# Patient Record
Sex: Female | Born: 1965 | Race: White | Hispanic: No | Smoking: Former smoker
Health system: Southern US, Community
[De-identification: ages and names within clinical notes are randomized; demographics above are authoritative.]

## PROBLEM LIST (undated history)

## (undated) DIAGNOSIS — F419 Anxiety disorder, unspecified: Secondary | ICD-10-CM

## (undated) DIAGNOSIS — L509 Urticaria, unspecified: Secondary | ICD-10-CM

## (undated) DIAGNOSIS — F32A Depression, unspecified: Secondary | ICD-10-CM

## (undated) DIAGNOSIS — K589 Irritable bowel syndrome without diarrhea: Secondary | ICD-10-CM

## (undated) DIAGNOSIS — L309 Dermatitis, unspecified: Secondary | ICD-10-CM

## (undated) DIAGNOSIS — F329 Major depressive disorder, single episode, unspecified: Secondary | ICD-10-CM

## (undated) DIAGNOSIS — G4700 Insomnia, unspecified: Secondary | ICD-10-CM

## (undated) HISTORY — DX: Major depressive disorder, single episode, unspecified: F32.9

## (undated) HISTORY — DX: Anxiety disorder, unspecified: F41.9

## (undated) HISTORY — DX: Depression, unspecified: F32.A

## (undated) HISTORY — PX: VAGINAL HYSTERECTOMY: SUR661

## (undated) HISTORY — DX: Urticaria, unspecified: L50.9

## (undated) HISTORY — PX: CHOLECYSTECTOMY: SHX55

## (undated) HISTORY — PX: TYMPANOSTOMY TUBE PLACEMENT: SHX32

## (undated) HISTORY — PX: OTHER SURGICAL HISTORY: SHX169

## (undated) HISTORY — PX: TONSILLECTOMY: SUR1361

## (undated) HISTORY — DX: Irritable bowel syndrome, unspecified: K58.9

## (undated) HISTORY — DX: Dermatitis, unspecified: L30.9

## (undated) HISTORY — DX: Insomnia, unspecified: G47.00

---

## 1998-02-01 ENCOUNTER — Other Ambulatory Visit: Admission: RE | Admit: 1998-02-01 | Discharge: 1998-02-01 | Payer: Self-pay | Admitting: Gastroenterology

## 1999-01-18 ENCOUNTER — Other Ambulatory Visit: Admission: RE | Admit: 1999-01-18 | Discharge: 1999-01-18 | Payer: Self-pay | Admitting: Obstetrics and Gynecology

## 1999-04-27 ENCOUNTER — Ambulatory Visit: Admission: RE | Admit: 1999-04-27 | Discharge: 1999-04-27 | Payer: Self-pay | Admitting: Obstetrics and Gynecology

## 1999-07-13 ENCOUNTER — Ambulatory Visit (HOSPITAL_COMMUNITY): Admission: RE | Admit: 1999-07-13 | Discharge: 1999-07-13 | Payer: Self-pay | Admitting: Internal Medicine

## 1999-07-13 ENCOUNTER — Encounter: Payer: Self-pay | Admitting: Internal Medicine

## 1999-11-14 ENCOUNTER — Ambulatory Visit (HOSPITAL_COMMUNITY): Admission: RE | Admit: 1999-11-14 | Discharge: 1999-11-14 | Payer: Self-pay | Admitting: Obstetrics and Gynecology

## 2000-03-14 ENCOUNTER — Other Ambulatory Visit: Admission: RE | Admit: 2000-03-14 | Discharge: 2000-03-14 | Payer: Self-pay | Admitting: Obstetrics and Gynecology

## 2000-08-18 ENCOUNTER — Encounter (INDEPENDENT_AMBULATORY_CARE_PROVIDER_SITE_OTHER): Payer: Self-pay | Admitting: Specialist

## 2000-08-18 ENCOUNTER — Ambulatory Visit (HOSPITAL_BASED_OUTPATIENT_CLINIC_OR_DEPARTMENT_OTHER): Admission: RE | Admit: 2000-08-18 | Discharge: 2000-08-18 | Payer: Self-pay | Admitting: *Deleted

## 2000-12-01 ENCOUNTER — Encounter (INDEPENDENT_AMBULATORY_CARE_PROVIDER_SITE_OTHER): Payer: Self-pay | Admitting: Specialist

## 2000-12-01 ENCOUNTER — Other Ambulatory Visit: Admission: RE | Admit: 2000-12-01 | Discharge: 2000-12-01 | Payer: Self-pay | Admitting: *Deleted

## 2001-07-28 ENCOUNTER — Other Ambulatory Visit: Admission: RE | Admit: 2001-07-28 | Discharge: 2001-07-28 | Payer: Self-pay | Admitting: *Deleted

## 2001-08-25 ENCOUNTER — Encounter (INDEPENDENT_AMBULATORY_CARE_PROVIDER_SITE_OTHER): Payer: Self-pay

## 2001-08-25 ENCOUNTER — Encounter: Payer: Self-pay | Admitting: *Deleted

## 2001-08-25 ENCOUNTER — Observation Stay (HOSPITAL_COMMUNITY): Admission: RE | Admit: 2001-08-25 | Discharge: 2001-08-26 | Payer: Self-pay | Admitting: *Deleted

## 2001-12-29 ENCOUNTER — Encounter: Admission: RE | Admit: 2001-12-29 | Discharge: 2001-12-29 | Payer: Self-pay | Admitting: Gastroenterology

## 2001-12-29 ENCOUNTER — Encounter: Payer: Self-pay | Admitting: Gastroenterology

## 2002-11-30 ENCOUNTER — Other Ambulatory Visit: Admission: RE | Admit: 2002-11-30 | Discharge: 2002-11-30 | Payer: Self-pay | Admitting: *Deleted

## 2003-12-22 ENCOUNTER — Other Ambulatory Visit: Admission: RE | Admit: 2003-12-22 | Discharge: 2003-12-22 | Payer: Self-pay | Admitting: *Deleted

## 2004-09-13 DIAGNOSIS — K5090 Crohn's disease, unspecified, without complications: Secondary | ICD-10-CM | POA: Insufficient documentation

## 2004-09-13 DIAGNOSIS — D126 Benign neoplasm of colon, unspecified: Secondary | ICD-10-CM | POA: Insufficient documentation

## 2004-12-17 ENCOUNTER — Other Ambulatory Visit: Admission: RE | Admit: 2004-12-17 | Discharge: 2004-12-17 | Payer: Self-pay | Admitting: *Deleted

## 2006-01-15 ENCOUNTER — Ambulatory Visit: Payer: Self-pay | Admitting: Cardiovascular Disease

## 2006-01-20 ENCOUNTER — Other Ambulatory Visit: Admission: RE | Admit: 2006-01-20 | Discharge: 2006-01-20 | Payer: Self-pay | Admitting: *Deleted

## 2007-01-29 ENCOUNTER — Other Ambulatory Visit: Admission: RE | Admit: 2007-01-29 | Discharge: 2007-01-29 | Payer: Self-pay | Admitting: *Deleted

## 2007-06-29 ENCOUNTER — Ambulatory Visit: Payer: Self-pay | Admitting: Cardiology

## 2007-06-29 LAB — CONVERTED CEMR LAB
Free T4: 0.8 ng/dL
T3, Free: 2.9 pg/mL
TSH: 0.6 u[IU]/mL

## 2007-08-03 ENCOUNTER — Ambulatory Visit: Payer: Self-pay | Admitting: Gastroenterology

## 2007-08-05 ENCOUNTER — Encounter: Payer: Self-pay | Admitting: Internal Medicine

## 2007-08-05 ENCOUNTER — Ambulatory Visit: Payer: Self-pay | Admitting: Internal Medicine

## 2007-08-13 ENCOUNTER — Ambulatory Visit: Payer: Self-pay | Admitting: Gastroenterology

## 2007-08-21 ENCOUNTER — Ambulatory Visit: Payer: Self-pay | Admitting: Gastroenterology

## 2007-08-27 ENCOUNTER — Ambulatory Visit: Payer: Self-pay | Admitting: Gastroenterology

## 2007-09-04 ENCOUNTER — Ambulatory Visit: Payer: Self-pay | Admitting: Gastroenterology

## 2007-09-15 ENCOUNTER — Ambulatory Visit: Payer: Self-pay | Admitting: Gastroenterology

## 2007-11-13 ENCOUNTER — Ambulatory Visit: Payer: Self-pay | Admitting: Gastroenterology

## 2008-03-28 ENCOUNTER — Other Ambulatory Visit: Admission: RE | Admit: 2008-03-28 | Discharge: 2008-03-28 | Payer: Self-pay | Admitting: Gynecology

## 2008-04-04 ENCOUNTER — Encounter: Admission: RE | Admit: 2008-04-04 | Discharge: 2008-04-04 | Payer: Self-pay | Admitting: Gynecology

## 2008-04-12 ENCOUNTER — Encounter: Admission: RE | Admit: 2008-04-12 | Discharge: 2008-04-12 | Payer: Self-pay | Admitting: Gynecology

## 2009-01-06 ENCOUNTER — Encounter: Payer: Self-pay | Admitting: Internal Medicine

## 2009-01-06 DIAGNOSIS — F329 Major depressive disorder, single episode, unspecified: Secondary | ICD-10-CM | POA: Insufficient documentation

## 2009-01-06 DIAGNOSIS — G44209 Tension-type headache, unspecified, not intractable: Secondary | ICD-10-CM | POA: Insufficient documentation

## 2009-01-06 DIAGNOSIS — Z9189 Other specified personal risk factors, not elsewhere classified: Secondary | ICD-10-CM | POA: Insufficient documentation

## 2009-03-29 ENCOUNTER — Encounter: Admission: RE | Admit: 2009-03-29 | Discharge: 2009-03-29 | Payer: Self-pay | Admitting: Gynecology

## 2009-07-31 ENCOUNTER — Ambulatory Visit: Payer: Self-pay | Admitting: Cardiovascular Disease

## 2009-07-31 ENCOUNTER — Encounter (INDEPENDENT_AMBULATORY_CARE_PROVIDER_SITE_OTHER): Payer: Self-pay | Admitting: *Deleted

## 2009-07-31 LAB — CONVERTED CEMR LAB
Cholesterol: 199 mg/dL (ref 0–200)
LDL Cholesterol: 121 mg/dL — ABNORMAL HIGH (ref 0–99)
Triglycerides: 93 mg/dL (ref 0.0–149.0)
VLDL: 18.6 mg/dL (ref 0.0–40.0)

## 2010-07-08 DIAGNOSIS — M797 Fibromyalgia: Secondary | ICD-10-CM

## 2010-07-08 HISTORY — DX: Fibromyalgia: M79.7

## 2010-08-07 NOTE — Letter (Signed)
Summary: Custom - Lipid  [facility], Main Office  [address]   [place], [place] [address]   Phone: [phone]  Fax: [phone]     July 31, 2009 MRN: [id number]   [facility] [address] [place], [place]  [address]   Dear Janice Spencer,  We have reviewed your cholesterol results.  They are as follows:     Total Cholesterol:    199 (Desirable: less than 200)       HDL  Cholesterol:     59.20  (Desirable: greater than 40 for men and 50 for women)       LDL Cholesterol:       121  (Desirable: less than 100 for low risk and less than 70 for moderate to high risk)       Triglycerides:       93.0  (Desirable: less than 150)  Our recommendations include:These numbers look good. Continue on the same medicine. Liver function is normal. Take care, Dr. Leanora Cover   Call our office at the number listed above if you have any questions.  Lowering your LDL cholesterol is important, but it is only one of a large number of "risk factors" that may indicate that you are at risk for heart disease, stroke or other complications of hardening of the arteries.  Other risk factors include:   A.  Cigarette Smoking* B.  High Blood Pressure* C.  Obesity* D.   Low HDL Cholesterol (see yours above)* E.   Diabetes Mellitus (higher risk if your is uncontrolled) F.  Family history of premature heart disease G.  Previous history of stroke or cardiovascular disease    *These are risk factors YOU HAVE CONTROL OVER.  For more information, visit .  There is now evidence that lowering the TOTAL CHOLESTEROL AND LDL CHOLESTEROL can reduce the risk of heart disease.  The American Heart Association recommends the following guidelines for the treatment of elevated cholesterol:  1.  If there is now current heart disease and less than two risk factors, TOTAL CHOLESTEROL should be less than 200 and LDL CHOLESTEROL should be less than 100. 2.  If there is current heart disease or two or  more risk factors, TOTAL CHOLESTEROL should be less than 200 and LDL CHOLESTEROL should be less than 70.  A diet low in cholesterol, saturated fat, and calories is the cornerstone of treatment for elevated cholesterol.  Cessation of smoking and exercise are also important in the management of elevated cholesterol and preventing vascular disease.  Studies have shown that 30 to 60 minutes of physical activity most days can help lower blood pressure, lower cholesterol, and keep your weight at a healthy level.  Drug therapy is used when cholesterol levels do not respond to therapeutic lifestyle changes (smoking cessation, diet, and exercise) and remains unacceptably high.  If medication is started, it is important to have you levels checked periodically to evaluate the need for further treatment options.  Thank you,    [employer] Team

## 2010-10-03 ENCOUNTER — Other Ambulatory Visit: Payer: Self-pay | Admitting: *Deleted

## 2010-10-03 ENCOUNTER — Telehealth: Payer: Self-pay | Admitting: *Deleted

## 2010-10-03 DIAGNOSIS — J40 Bronchitis, not specified as acute or chronic: Secondary | ICD-10-CM

## 2010-10-03 MED ORDER — AZITHROMYCIN 250 MG PO TABS: 250.0000 mg | ORAL_TABLET | Freq: Every day | ORAL | Status: DC

## 2010-10-03 NOTE — Telephone Encounter (Signed)
ERROR

## 2010-10-22 LAB — HM MAMMOGRAPHY: HM Mammogram: NORMAL

## 2010-11-23 NOTE — Op Note (Signed)
[facility]  Patient:    Janice Spencer, Janice Spencer Visit Number: [id number] MRN: [id number]          Service Type: SUR Location: 4W 0455 01 Attending Physician:  Collene Schlichter Dictated by:   Almedia Balls Randell Patient, M.D. Proc. Date: 08/25/01 Admit Date:  08/25/2001   CC:         Harl Bowie, M.D.   Operative Report  PREOPERATIVE DIAGNOSES: 1. Abnormal uterine bleeding. 2. History of endometriosis. 3. Uterine enlargement. 4. Pelvic pain. 5. Pelvic adhesions.  POSTOPERATIVE DIAGNOSES: 1. Abnormal uterine bleeding. 2. History of endometriosis. 3. Uterine enlargement. 4. Pelvic pain. 5. Pelvic adhesions. 6. Pending pathology.  OPERATION: 1. Laparoscopy. 2. Vaginal hysterectomy. 3. Left salpingo-oophorectomy.  ANESTHESIA:  General orotracheal.  OPERATOR:  Almedia Balls. Randell Patient, M.D.  FIRST ASSISTANT:  Harl Bowie, M.D.  INDICATION FOR SURGERY:  The patient is a 45 year old with the above-noted problems, who was counseled as to the need for surgery to treat these problems.  She was fully counseled as to the nature of the procedure and the risks involved to include risks of anesthesia, injury to bowel, bladder, blood vessels, ureters, postoperative hemorrhage, infection, recuperation, use of hormone replacement should her ovaries be removed.  She fully understands all of these considerations and wishes to proceed on August 25, 2001.  OPERATIVE FINDINGS:  On laparoscopy, the lower liver edge, gallbladder, spleen, area of the appendix were normal to visualization.  In the pelvis, the uterus was enlarged to approximately [redacted] weeks gestational size.  The right tube and ovary appeared normal.  The left tube and ovary were involved with adhesions from the rectosigmoid overlying these structures.  DESCRIPTION OF PROCEDURE:  With the patient under general anesthesia, prepared and draped in the usual sterile fashion, a speculum was placed in the  vagina, and the cervix was grasped with a single-tooth tenaculum and an acorn cannula was placed as well.  The patient was then catheterized with free flow of clear urine.  She was then prepared and draped for a laparoscopy procedure.  An incision was made in the lower pole of the umbilicus with insertion of the Veress cannula and insufflation of 3 liters of carbon dioxide.  A disposable 10 mm probe was inserted through this incision with placement of the 10 mm operative scope as well.  A 5 mm disposable probe was placed through the abdomen just above the symphysis pubis for probe sites.  The above-noted findings were visualized.  The left tube and ovary were then grasped with atraumatic graspers and elevated away from the lateral pelvic sidewall. Bipolar  electrocoagulation was used very close to the uterus with the infundibulopelvic ligament to provide hemostasis of the vessels.  These structures were sequentially electrocoagulated and cut free so that the left tube and ovary could be removed with the specimen.  On the right, the uteroovarian anastomosis, tube, and round ligament were rendered hemostatic with bipolar electrocoagulation.  These areas were gradually transected so that the right tube and ovary was conserved.  A bladder flap was created on the anterior surface of the uterus by using bipolar electrocoagulation for hemostasis and then gradual transection for development of the bladder flap. At this point, with good hemostasis in the peritoneal area, the patient was positioned and draped for vaginal procedure.  A weighted speculum was placed in the posterior vagina, and the cervix was grasped with two Loman [place].  A solution of 1% lidocaine with 1:200,000 epinephrine was injected circumferentially on  the cervix.  A total of 10 mL was used.  An incision was made in the vaginal mucosa anterior to the cervix for development of the bladder flap anteriorly.  The bladder was  pushed off of the cervix and lower uterine segment without difficulty.  An attempt was made to invert the uterus which was unsuccessful.  Accordingly, an incision was made in the posterior cul-de-sac with entry into this area without difficulty. A longer weighted speculum was placed in the posterior cul-de-sac, and Heaney clamps were used to clamp in succession the vaginal cuff angles and uterosacral ligaments bilaterally, cardinal ligaments bilaterally, and uterine vessels bilaterally; these structures were successfully cut free and individually suture ligated with 1 chromic catgut.  It was then possible to invert the uterus, and Heaney clamps were placed across the remaining portions of the broad ligaments bilaterally which were remaining.  These structures were cut with removal of the uterus and left tube and ovary.  The broad ligament tags were tied off with interrupted free ties of 1 chromic catgut. The area was then lavaged with copious amounts of Lactate Ringers solution and, after noting that hemostasis was maintained and that sponge and instruments were correct, the peritoneum was closed with a pursestring suture of #1 chromic catgut.  A modified McCall suture had been placed in the posterior cul-de-sac prior to closure of the peritoneum as well.  Vaginal cuff was then reapproximated and rendered hemostatic with a continuous interlocking suture of 1 chromic catgut.  The uterosacral ligament sutures which had been held long were then tied together in the midline for further compression and hemostasis.  The McCall suture was then tied with good elevation of the vaginal cuff.  The area was then lavaged with copious amounts of Lactated Ringers solution and, after noting that hemostasis was maintained in this area, the patient was catheterized with free flow of clear urine.  She was then positioned and draped for a laparoscopic procedure.  The area was inspected closely using the  laparoscope and lavaged with copious amounts of  Lactated Ringers solution.  After noting that hemostasis was maintained following reduction of intra-abdominal pressure by allowing gas to escape and that sponge and instruments counts were correct, the instruments were removed from the peritoneal cavity.  Gas was allowed to fully escape, and the incisions were closed with fascial sutures of 0 Vicryl and subcuticular sutures of 3-0 plain catgut.  Estimated blood loss 100 mL.  The patient was taken to the recovery room in good condition.  She will be placed on 23-hour observation following surgery. Dictated by:   Almedia Balls Randell Patient, M.D. Attending Physician:  Collene Schlichter DD:  08/25/01 TD:  08/25/01 Job: [id number]

## 2010-11-23 NOTE — Discharge Summary (Signed)
[facility]  Patient:    Janice Spencer, Janice Spencer Visit Number: [id number] MRN: [id number]          Service Type: SUR Location: 4W 0455 01 Attending Physician:  Collene Schlichter Dictated by:   Almedia Balls Randell Patient, M.D. Admit Date:  08/25/2001 Disc. Date: 08/26/01                             Discharge Summary  HISTORY OF PRESENT ILLNESS:  The patient is a 45 year old with abnormal uterine bleeding, pelvic pain, uterine enlargement, history of endometriosis, pelvic adhesions for hysterectomy with possible bilateral salpingo-oophorectomy.  The remainder of her History and Physical are as previously dictated.  LABORATORY DATA AND X-RAY FINDINGS:  Preoperative hemoglobin of 15.1.  Chest x-ray showed no acute distress, but some increased markings.  HOSPITAL COURSE:  The patient was taken to the operating room on August 25, 2001, at which time laparoscopy, vaginal hysterectomy and left salpingo-oophorectomy were performed.  The patient did well postoperatively except that late in the evening on February 18, she was unable to void and a Foley catheter had to be placed.  The catheter was removed on the early morning of February 19, and the patient began voiding without difficulty.  On the morning of February 19, she was ambulatory, voiding without difficulty and in good condition.  It was felt that she could be discharged at this time.  DISCHARGE DIAGNOSES: 1. Abnormal uterine bleeding. 2. Pelvic pain. 3. History of endometriosis. 4. History of ovarian cyst. 5. History of pelvic adhesions.  PROCEDURE:  Laparoscopy with vaginal hysterectomy and left salpingo-oophorectomy.  Pathology report unavailable at the time of dictation.  DISPOSITION:  Discharged home to return to the office in two weeks for followup.  ACTIVITY:  She was instructed to gradually progress her activities over several weeks at home and limit driving for two weeks.  SPECIAL INSTRUCTIONS:   She will call for any problems.  DISCHARGE MEDICATIONS: 1. Darvocet-N 100 generic #30 to be taken 1/2 to 1 q.4h. p.r.n. pain. 2. Doxycycline 100 mg #12 to be taken one b.i.d.  DIET:  Regular.  CONDITION ON DISCHARGE:  Good. Dictated by:   Almedia Balls Randell Patient, M.D. Attending Physician:  Collene Schlichter DD:  08/26/01 TD:  08/26/01 Job: [id number]

## 2010-11-23 NOTE — Op Note (Signed)
[facility]  Patient:    Janice Spencer, Janice Spencer                    MRN: [id number] Proc. Date: 08/18/00 Attending:  Janet Berlin. Dan Humphreys, M.D.                           Operative Report  PREOPERATIVE DIAGNOSIS:  Pigmented skin lesion of right lower chin, 4 mm.  POSTOPERATIVE DIAGNOSIS:  Pigmented skin lesion of right lower chin, 4 mm.  PROCEDURE PERFORMED:  Excisional biopsy of 4 mm pigmented skin lesion of right lower chin.  SURGEON:  Janet Berlin. Dan Humphreys, M.D.  ASSISTANT:  None.  ANESTHESIA:  Local.  INDICATIONS:  Janice Spencer is a 45 year old young woman, who presents with a pigmented skin lesion in the right lower chin.  This has recently begun to intermittently bleed.  She is taken to the operating room at this time for excisional biopsy.  DESCRIPTION OF PROCEDURE:  The patient was brought back to the minor surgery operating room and placed on the table in a supine position.  The operative field was sterilely prepped and draped.  I used 1% Xylocaine containing epinephrine to achieve local anesthesia.  The lesion was then excised full-thickness and passed off the operative field for permanent sections. Resulting defect was linearly closed with three interrupted 6-0 monofilament nylon sutures.  A sterile dressing was placed.  The patient was given wound care instructions and will see me later this week for wound check, suture removal, and review of her final pathology report. DD:  08/18/00 TD:  08/18/00 Job: [id number]

## 2010-11-23 NOTE — Op Note (Signed)
[facility]  Patient:    Janice Spencer, Janice Spencer                     MRN: [id number] Proc. Date: 11/14/99 Adm. Date:  [id number] Attending:  Madelyn Flavors                           Operative Report  PREOPERATIVE DIAGNOSIS:       Menometrorrhagia, endometrial polyps.  POSTOPERATIVE DIAGNOSIS:      Menometrorrhagia, endometrial polyps.  OPERATION:                    Dilatation and curettage, hysteroscopy.  SURGEON:                      Beather Arbour. Thomasena Edis, M.D.  ASSISTANT:  ANESTHESIA:                   Monitored anesthesia care plus 10 cc of 1% lidocaine paracervical block.  ESTIMATED BLOOD LOSS:         Minimal.  DRAINS:                       None.  FLUIDS:                       Approximately 900 cc of Crystalloid.  COMPLICATIONS:                None.  DESCRIPTION OF PROCEDURE:     The patient was brought to the operating room and  identified on the operating table.  After the patient was adequately sedated with MAC analgesia, she was placed in the dorsal lithotomy position and prepped and draped in the usual sterile fashion.  The bladder was straight catheterized for  approximately 100 cc of clear yellow urine.  Bimanual examination was performed and the uterus was found to be in the anteverted position.  The anterior lip of the  cervix was infiltrated with 1 cc of 1% lidocaine and grasped with a single tooth tenaculum.  The remaining 9 cc were placed for a paracervical block.  The cervix was very gently dilated to a #25 Pratt dilator.  The hysteroscope was placed. Using Sorbitol as the distending medium, a very careful and thorough hysteroscopy was performed.  The polypoid tissue was again identified.  Both tubal ostia were identified.  The scope was removed and the uterus was systematically curetted in a systematic clockwise fashion with tissue obtained and sent to pathology for examination.  The Randall stone forceps were placed  and additional tissue was obtained.  The hysteroscope was again replaced and the area was noted to have been sampled in its entirety.  At that point the procedure was terminated.  The patient tolerated the procedure well without apparent complications and was transferred to the recovery room in stable condition after all sponge, needle, and instrument counts were correct.  The patient was urged to take Advil 400 to 600 mg every six hours as needed for pain, call with any problems and return in two to three weeks for a postoperative evaluation. DD:  11/14/99 TD:  11/15/99 Job: [id number]

## 2010-11-23 NOTE — H&P (Signed)
[facility]  Patient:    Janice Spencer, Janice Spencer Visit Number: [id number] MRN: [id number]          Service Type: Attending:  Almedia Balls. Randell Patient, M.D. Dictated by:   Almedia Balls Randell Patient, M.D. Adm. Date:  08/25/01                           History and Physical  CHIEF COMPLAINT:  Pain, abnormal bleeding.  HISTORY OF PRESENT ILLNESS:  The patient is a 45 year old gravida 2, para 2, whose last menstrual period was approximately six months ago.  She has been followed in our office since November 2001, with abnormal uterine bleeding and pain with use of oral contraceptives for menstrual control.  She began to break through on the oral contraceptives and have pain, particularly in the left lower quadrant, which was evaluated with ultrasound and laser laparoscopy, which showed bilateral ovarian cysts which were benign and extensive adhesions on the left adnexal areas as well as some endometriosis. The procedure was accomplished in May 2002.  Because of the endometriosis, it was suggested that she take continuous oral contraceptives for six months, which was attempted.  The patient continued to have breakthrough bleeding and severe pain, particularly on the left, with this regime and was tried on other medications including Lupron Depot without success.  She continued to bleed and have pain on the Lupron Depot and has required narcotics at times for the pain.  Examination in January 2003, revealed tenderness again on the left with enlargement of the uterus, thought to be adenomyosis.  Pap smear at that time was normal.  She is admitted at this time for laparoscopic-assisted vaginal hysterectomy, possible transabdominal hysterectomy, positive bilateral salpingo-oophorectomy.  She has been fully counseled as to the nature of the procedure and the risks involved to include risks of anesthesia, injury to bowel, bladder, blood vessels, ureters, postoperative hemorrhage,  infection, recuperation, and use of hormone replacement should her ovaries be removed. She fully understands all of these considerations and wishes to proceed on Aug 25, 2001.  PAST MEDICAL HISTORY: 1. Cholecystectomy in 1988. 2. D&C for abnormal bleeding in May 2001. 3. Bone spur from nose, nasal septum in 2000. 4. Laparoscopy in 2002.  ALLERGIES:  She is allergic to CODEINE, MORPHINE, and PENICILLIN.  MEDICATIONS:  She has taken the above-noted medications and has continued on Darvocet for pain.  TOBACCO/ALCOHOL:  She smokes approximately one pack of cigarettes per day.  FAMILY HISTORY:  Noncontributory.  REVIEW OF SYSTEMS:  HEENT:  Stress-related headaches, improved with simple medications.  CARDIORESPIRATORY:  Negative.  GASTROINTESTINAL:  Negative. GENITOURINARY:  As in present illness.  NEUROMUSCULAR:  Negative.  PHYSICAL EXAMINATION:  VITAL SIGNS:  Height 5 feet 4 inches, weight 133 pounds.  Blood pressure 120/64, pulse 72, respirations 18.  GENERAL:  Well-developed white female in no acute distress.  HEENT:  Within normal limits.  NECK:  Supple.  Without masses, adenopathy, or bruits.  HEART:  Regular rate and rhythm.  Without murmurs.  LUNGS:  Clear to P&A.  BREASTS:  Examined sitting and lying, without mass.  Axilla negative.  ABDOMEN:  Flat and soft.  Somewhat tender in the left lower quadrant, without palpable mass.  PELVIC:  External genitalia, Bartholin, urethral, and Skenes glands within normal limits.  Cervix slightly inflamed.  Uterus mid position, approximately 6-[redacted] weeks gestational size.  Somewhat soft and tender.  Adnexa tender bilaterally, left greater than right, with some fullness on the  left. Anterior and posterior cul-de-sac exam was confirmatory.  EXTREMITIES:  Within normal limits.  CNS:  Grossly intact.  SKIN:  Without suspicious lesions.  IMPRESSION:  Pelvic pain.  Abnormal uterine bleeding.  Uterine enlargement, rule out  adenomyosis, pelvic adhesions.  PLAN:  As noted above. Dictated by:   Almedia Balls Randell Patient, M.D. Attending:  Almedia Balls. Fore, M.D. DD:  08/20/01 TD:  08/20/01 Job: [id number]

## 2011-04-29 ENCOUNTER — Ambulatory Visit (INDEPENDENT_AMBULATORY_CARE_PROVIDER_SITE_OTHER): Payer: Self-pay

## 2011-04-29 DIAGNOSIS — R197 Diarrhea, unspecified: Secondary | ICD-10-CM

## 2011-05-03 ENCOUNTER — Telehealth: Payer: Self-pay | Admitting: Internal Medicine

## 2011-05-03 NOTE — Telephone Encounter (Signed)
Faxed patient's ROI to [phone] Attn: Bennie Dallas...05-03-11 djc

## 2011-05-13 ENCOUNTER — Encounter: Payer: Self-pay | Admitting: Internal Medicine

## 2011-05-13 ENCOUNTER — Ambulatory Visit (AMBULATORY_SURGERY_CENTER): Payer: Self-pay | Admitting: Internal Medicine

## 2011-05-13 DIAGNOSIS — D126 Benign neoplasm of colon, unspecified: Secondary | ICD-10-CM

## 2011-05-13 DIAGNOSIS — K589 Irritable bowel syndrome without diarrhea: Secondary | ICD-10-CM

## 2011-05-13 DIAGNOSIS — Z860100 Personal history of colon polyps, unspecified: Secondary | ICD-10-CM

## 2011-05-13 DIAGNOSIS — R1013 Epigastric pain: Secondary | ICD-10-CM

## 2011-05-13 DIAGNOSIS — Z1211 Encounter for screening for malignant neoplasm of colon: Secondary | ICD-10-CM

## 2011-05-13 DIAGNOSIS — K3189 Other diseases of stomach and duodenum: Secondary | ICD-10-CM

## 2011-05-13 HISTORY — PX: COLONOSCOPY: SHX174

## 2011-05-13 MED ORDER — SODIUM CHLORIDE 0.9 % IV SOLN: 500.0000 mL | INTRAVENOUS | Status: DC

## 2011-05-13 NOTE — Patient Instructions (Signed)
Handouts on polyps and high fiber diet  Follow SALIX protocol  Discharge instructions per blue and green sheets  We will mail you a letter in 1-2 weeks with the pathology results and dr brodie's recommendations.  Repeat colonoscopy in 10 years. We will mail you a reminder letter of this in the year 2022

## 2011-05-14 ENCOUNTER — Telehealth: Payer: Self-pay | Admitting: *Deleted

## 2011-05-14 NOTE — Telephone Encounter (Signed)

## 2011-05-24 ENCOUNTER — Ambulatory Visit: Payer: Self-pay

## 2011-06-03 ENCOUNTER — Ambulatory Visit (INDEPENDENT_AMBULATORY_CARE_PROVIDER_SITE_OTHER): Payer: Self-pay

## 2011-06-03 DIAGNOSIS — R197 Diarrhea, unspecified: Secondary | ICD-10-CM

## 2011-06-17 ENCOUNTER — Ambulatory Visit: Payer: Self-pay

## 2011-06-18 ENCOUNTER — Encounter: Payer: Self-pay | Admitting: Internal Medicine

## 2011-06-25 LAB — HM PAP SMEAR: HM Pap smear: NORMAL

## 2011-09-05 ENCOUNTER — Ambulatory Visit (INDEPENDENT_AMBULATORY_CARE_PROVIDER_SITE_OTHER): Payer: Self-pay

## 2011-09-05 DIAGNOSIS — R197 Diarrhea, unspecified: Secondary | ICD-10-CM

## 2011-09-19 ENCOUNTER — Ambulatory Visit (INDEPENDENT_AMBULATORY_CARE_PROVIDER_SITE_OTHER): Payer: Self-pay

## 2011-09-19 DIAGNOSIS — R197 Diarrhea, unspecified: Secondary | ICD-10-CM

## 2011-10-18 ENCOUNTER — Encounter: Payer: Self-pay | Admitting: Internal Medicine

## 2011-10-18 ENCOUNTER — Other Ambulatory Visit (INDEPENDENT_AMBULATORY_CARE_PROVIDER_SITE_OTHER): Payer: 59

## 2011-10-18 ENCOUNTER — Ambulatory Visit (INDEPENDENT_AMBULATORY_CARE_PROVIDER_SITE_OTHER): Payer: 59 | Admitting: Internal Medicine

## 2011-10-18 VITALS — BP 138/64 | HR 84 | Temp 98.8°F | Resp 16 | Ht 64.0 in | Wt 145.0 lb

## 2011-10-18 DIAGNOSIS — R5381 Other malaise: Secondary | ICD-10-CM

## 2011-10-18 DIAGNOSIS — R5383 Other fatigue: Secondary | ICD-10-CM

## 2011-10-18 DIAGNOSIS — M797 Fibromyalgia: Secondary | ICD-10-CM

## 2011-10-18 DIAGNOSIS — IMO0001 Reserved for inherently not codable concepts without codable children: Secondary | ICD-10-CM

## 2011-10-18 LAB — COMPREHENSIVE METABOLIC PANEL
Alkaline Phosphatase: 53 U/L (ref 39–117)
BUN: 17 mg/dL (ref 6–23)
Creatinine, Ser: 0.8 mg/dL (ref 0.4–1.2)
Glucose, Bld: 88 mg/dL (ref 70–99)
Total Bilirubin: 0.2 mg/dL — ABNORMAL LOW (ref 0.3–1.2)

## 2011-10-18 LAB — SEDIMENTATION RATE: Sed Rate: 10 mm/h (ref 0–22)

## 2011-10-18 LAB — CBC WITH DIFFERENTIAL/PLATELET
Basophils Relative: 2.3 % (ref 0.0–3.0)
Eosinophils Relative: 3.7 % (ref 0.0–5.0)
HCT: 41.5 % (ref 36.0–46.0)
MCV: 90.7 fl (ref 78.0–100.0)
Monocytes Absolute: 0.5 10*3/uL (ref 0.1–1.0)
Neutrophils Relative %: 49.3 % (ref 43.0–77.0)
RBC: 4.58 Mil/uL (ref 3.87–5.11)
WBC: 6.5 10*3/uL (ref 4.5–10.5)

## 2011-10-18 LAB — TSH: TSH: 1.07 u[IU]/mL (ref 0.35–5.50)

## 2011-10-18 MED ORDER — PREGABALIN 75 MG PO CAPS: 75.0000 mg | ORAL_CAPSULE | Freq: Every day | ORAL | Status: DC

## 2011-10-18 NOTE — Assessment & Plan Note (Signed)
I think this is part of her FMG but I will check labs today to look for other causes

## 2011-10-18 NOTE — Patient Instructions (Signed)

## 2011-10-18 NOTE — Progress Notes (Signed)
Subjective:    Patient ID: Janice Spencer, female    DOB: May 02, 1966, 46 y.o.   MRN: [id number]  HPI  New to me she complains of a several month history of feeling achy all over and having fatigue. She has DFA and FA's. Ibuprofen has not helped. She has been very sad over the last year since her daughter left for college. She is divorced and lives alone now. She stopped taking zoloft one month ago b/c she felt flat and did not experience any emotions.  Review of Systems  Constitutional: Positive for fatigue and unexpected weight change (mild weight gain). Negative for fever, chills, diaphoresis, activity change and appetite change.  HENT: Negative.   Eyes: Negative.   Respiratory: Negative for cough, chest tightness, shortness of breath, wheezing and stridor.   Cardiovascular: Negative for chest pain, palpitations and leg swelling.  Gastrointestinal: Negative for nausea, vomiting, abdominal pain, diarrhea, constipation, blood in stool, abdominal distention, anal bleeding and rectal pain.  Genitourinary: Negative.   Musculoskeletal: Positive for myalgias. Negative for back pain, joint swelling, arthralgias and gait problem.  Skin: Negative for color change, pallor, rash and wound.  Neurological: Negative for dizziness, tremors, syncope, facial asymmetry, speech difficulty, weakness, light-headedness, numbness and headaches.  Hematological: Negative for adenopathy. Does not bruise/bleed easily.  Psychiatric/Behavioral: Negative.        Objective:   Physical Exam  Vitals reviewed. Constitutional: She is oriented to person, place, and time. She appears well-developed and well-nourished. No distress.  HENT:  Head: Normocephalic and atraumatic.  Mouth/Throat: Oropharynx is clear and moist. No oropharyngeal exudate.  Eyes: Conjunctivae are normal. Right eye exhibits no discharge. Left eye exhibits no discharge. No scleral icterus.  Neck: Normal range of motion. Neck supple. No JVD present.  No tracheal deviation present. No thyromegaly present.  Cardiovascular: Normal rate, regular rhythm, normal heart sounds and intact distal pulses.  Exam reveals no gallop and no friction rub.   No murmur heard. Pulmonary/Chest: Effort normal and breath sounds normal. No stridor. No respiratory distress. She has no wheezes. She has no rales. She exhibits no tenderness.  Abdominal: Soft. Bowel sounds are normal. She exhibits no distension and no mass. There is no tenderness. There is no rebound and no guarding.  Musculoskeletal: Normal range of motion. She exhibits no edema and no tenderness.  Lymphadenopathy:    She has no cervical adenopathy.  Neurological: She is oriented to person, place, and time.  Skin: Skin is warm and dry. No rash noted. She is not diaphoretic. No erythema. No pallor.  Psychiatric: Her behavior is normal. Judgment and thought content normal. Her mood appears not anxious. Her affect is not angry, not blunt, not labile and not inappropriate. Her speech is not rapid and/or pressured, not delayed, not tangential and not slurred. She is not agitated, not aggressive, is not hyperactive, not slowed, not withdrawn, not actively hallucinating and not combative. Thought content is not paranoid and not delusional. Cognition and memory are normal. Cognition and memory are not impaired. She does not express impulsivity or inappropriate judgment. She exhibits a depressed mood (tearful intermittently). She expresses no homicidal and no suicidal ideation. She expresses no suicidal plans and no homicidal plans. She is communicative. She exhibits normal recent memory and normal remote memory. She is attentive.     Lab Results  Component Value Date   CHOL 199 07/31/2009   TRIG 93.0 07/31/2009   HDL 59.20 07/31/2009   LDLCALC 121* 07/31/2009   TSH 0.60 06/29/2007  Assessment & Plan:

## 2011-10-18 NOTE — Assessment & Plan Note (Signed)
I have asked her to start lyrica at bedtime to improve her sleep quality and to try biofeedback, today I will check her labs to look for inflammatory, metabolic, and blood disease

## 2011-10-19 LAB — C-REACTIVE PROTEIN: CRP: 0.03 mg/dL (ref <0.60)

## 2011-10-21 ENCOUNTER — Encounter: Payer: Self-pay | Admitting: Internal Medicine

## 2011-10-22 ENCOUNTER — Telehealth: Payer: Self-pay | Admitting: Internal Medicine

## 2011-10-22 NOTE — Telephone Encounter (Signed)
Pt Signed ROI, gave her Copies of Labs  10/22/11/Km

## 2011-11-19 ENCOUNTER — Other Ambulatory Visit: Payer: Self-pay | Admitting: *Deleted

## 2011-11-19 DIAGNOSIS — M797 Fibromyalgia: Secondary | ICD-10-CM

## 2011-11-19 MED ORDER — PREGABALIN 75 MG PO CAPS: 75.0000 mg | ORAL_CAPSULE | Freq: Every day | ORAL | Status: DC

## 2011-11-28 ENCOUNTER — Ambulatory Visit (INDEPENDENT_AMBULATORY_CARE_PROVIDER_SITE_OTHER): Payer: Self-pay

## 2011-11-28 DIAGNOSIS — R197 Diarrhea, unspecified: Secondary | ICD-10-CM

## 2011-12-06 ENCOUNTER — Ambulatory Visit: Payer: 59 | Admitting: Internal Medicine

## 2011-12-10 ENCOUNTER — Ambulatory Visit (INDEPENDENT_AMBULATORY_CARE_PROVIDER_SITE_OTHER): Payer: 59 | Admitting: Internal Medicine

## 2011-12-10 VITALS — BP 108/68 | HR 85 | Temp 98.8°F | Resp 16 | Wt 145.0 lb

## 2011-12-10 DIAGNOSIS — IMO0001 Reserved for inherently not codable concepts without codable children: Secondary | ICD-10-CM

## 2011-12-10 DIAGNOSIS — M797 Fibromyalgia: Secondary | ICD-10-CM

## 2011-12-10 NOTE — Progress Notes (Signed)
  Subjective:    Patient ID: Janice Spencer, female    DOB: [birth year], 46 y.o.   MRN: [id number]  HPI  She returns for f/up and she tells me that lyrica did not help much and she feels like it made her feel more fatigued. She continues to complain of feeling achy all over.  Review of Systems  Constitutional: Negative for fever, chills, diaphoresis, activity change, appetite change, fatigue and unexpected weight change.  HENT: Negative.   Eyes: Negative.   Respiratory: Negative for apnea, cough, chest tightness, shortness of breath, wheezing and stridor.   Cardiovascular: Negative for chest pain, palpitations and leg swelling.  Gastrointestinal: Negative for nausea, vomiting, abdominal pain, diarrhea, constipation, blood in stool and anal bleeding.  Genitourinary: Negative.   Musculoskeletal: Positive for myalgias. Negative for back pain, joint swelling, arthralgias and gait problem.  Skin: Negative for color change, pallor, rash and wound.  Neurological: Negative.   Hematological: Negative for adenopathy. Does not bruise/bleed easily.  Psychiatric/Behavioral: Negative.        Objective:   Physical Exam  Vitals reviewed. Constitutional: She is oriented to person, place, and time. She appears well-developed and well-nourished. No distress.  HENT:  Head: Normocephalic and atraumatic.  Mouth/Throat: Oropharynx is clear and moist. No oropharyngeal exudate.  Eyes: Conjunctivae are normal. Right eye exhibits no discharge. Left eye exhibits no discharge. No scleral icterus.  Neck: Normal range of motion. Neck supple. No JVD present. No tracheal deviation present. No thyromegaly present.  Cardiovascular: Normal rate, regular rhythm, normal heart sounds and intact distal pulses.  Exam reveals no gallop and no friction rub.   No murmur heard. Pulmonary/Chest: Effort normal and breath sounds normal. No stridor. No respiratory distress. She has no wheezes. She has no rales. She exhibits no  tenderness.  Abdominal: Soft. Bowel sounds are normal. She exhibits no distension and no mass. There is no tenderness. There is no rebound and no guarding.  Musculoskeletal: Normal range of motion. She exhibits no edema and no tenderness.  Lymphadenopathy:    She has no cervical adenopathy.  Neurological: She is oriented to person, place, and time.  Skin: Skin is warm and dry. No rash noted. She is not diaphoretic. No erythema. No pallor.  Psychiatric: She has a normal mood and affect. Her behavior is normal. Judgment and thought content normal.      Lab Results  Component Value Date   WBC 6.5 10/18/2011   HGB 14.0 10/18/2011   HCT 41.5 10/18/2011   PLT 224.0 10/18/2011   GLUCOSE 88 10/18/2011   CHOL 199 07/31/2009   TRIG 93.0 07/31/2009   HDL 59.20 07/31/2009   LDLCALC 121* 07/31/2009   ALT 12 10/18/2011   AST 14 10/18/2011   NA 141 10/18/2011   K 4.2 10/18/2011   CL 106 10/18/2011   CREATININE 0.8 10/18/2011   BUN 17 10/18/2011   CO2 28 10/18/2011   TSH 1.07 10/18/2011      Assessment & Plan:

## 2011-12-13 ENCOUNTER — Encounter: Payer: Self-pay | Admitting: Internal Medicine

## 2011-12-13 NOTE — Assessment & Plan Note (Signed)
All of her inflammatory markers were normal so I don't think she has an inflammatory disease but I do think she has fibromyalgia, she will stop lyrica because it did not help much and it caused fatigue, I want her to ask her psych if she can try savella, I am concerned that it should not be added to wellbutrin, in the meantime she will continue working on lifestyle measures

## 2011-12-13 NOTE — Patient Instructions (Signed)

## 2011-12-16 ENCOUNTER — Ambulatory Visit (INDEPENDENT_AMBULATORY_CARE_PROVIDER_SITE_OTHER): Payer: Self-pay

## 2011-12-16 DIAGNOSIS — R197 Diarrhea, unspecified: Secondary | ICD-10-CM

## 2011-12-27 ENCOUNTER — Telehealth: Payer: Self-pay | Admitting: Internal Medicine

## 2011-12-27 DIAGNOSIS — M797 Fibromyalgia: Secondary | ICD-10-CM

## 2011-12-27 MED ORDER — PREGABALIN 75 MG PO CAPS: 75.0000 mg | ORAL_CAPSULE | Freq: Two times a day (BID) | ORAL | Status: DC

## 2011-12-27 NOTE — Telephone Encounter (Signed)
Needs refill on lyrica, states she did go off the med as advised and states that the med really was working for her and request refill, please call into [facility]

## 2011-12-27 NOTE — Telephone Encounter (Signed)
done

## 2012-01-17 ENCOUNTER — Telehealth: Payer: Self-pay | Admitting: Internal Medicine

## 2012-01-17 DIAGNOSIS — M797 Fibromyalgia: Secondary | ICD-10-CM

## 2012-01-17 MED ORDER — TRAMADOL HCL 50 MG PO TABS: 50.0000 mg | ORAL_TABLET | Freq: Three times a day (TID) | ORAL | Status: AC | PRN

## 2012-01-17 NOTE — Telephone Encounter (Signed)
Pt informed of new rx for pain.

## 2012-01-17 NOTE — Telephone Encounter (Signed)
done

## 2012-01-17 NOTE — Telephone Encounter (Signed)
Caller: [name]/Patient; Phone Number: [phone]; Message from caller:   Pt requesting pain medication to help with her Fibromyalgia pain that has been more bothersome at night since ~ 01/03/2012   Requests Ultram if possible.  Still taking Lyrica 1 hr bfore bedtime that was prescribed at last OV, but for the last 2 weeks increased bouts of pain. Pt can be called back at [phone] until 6 pm .

## 2012-02-04 ENCOUNTER — Other Ambulatory Visit (HOSPITAL_COMMUNITY): Payer: Self-pay | Admitting: Physician Assistant

## 2012-02-04 MED ORDER — DOXYCYCLINE HYCLATE 50 MG PO CAPS: 100.0000 mg | ORAL_CAPSULE | Freq: Two times a day (BID) | ORAL | Status: AC

## 2012-02-18 ENCOUNTER — Ambulatory Visit (INDEPENDENT_AMBULATORY_CARE_PROVIDER_SITE_OTHER): Payer: Self-pay

## 2012-02-18 DIAGNOSIS — Z0279 Encounter for issue of other medical certificate: Secondary | ICD-10-CM

## 2012-02-18 DIAGNOSIS — R197 Diarrhea, unspecified: Secondary | ICD-10-CM

## 2012-07-03 ENCOUNTER — Other Ambulatory Visit: Payer: Self-pay | Admitting: Internal Medicine

## 2012-07-03 NOTE — Telephone Encounter (Signed)
Ok to Rf? 

## 2012-08-19 ENCOUNTER — Other Ambulatory Visit: Payer: Self-pay | Admitting: Occupational Medicine

## 2012-08-19 ENCOUNTER — Ambulatory Visit: Payer: Self-pay

## 2012-08-19 DIAGNOSIS — G8929 Other chronic pain: Secondary | ICD-10-CM

## 2012-08-19 DIAGNOSIS — M542 Cervicalgia: Secondary | ICD-10-CM

## 2012-10-02 ENCOUNTER — Telehealth: Payer: Self-pay | Admitting: *Deleted

## 2012-10-02 DIAGNOSIS — H6691 Otitis media, unspecified, right ear: Secondary | ICD-10-CM

## 2012-10-02 MED ORDER — NEOMYCIN-POLYMYXIN-HC 3.5-10000-1 OT SUSP: 4.0000 [drp] | Freq: Three times a day (TID) | OTIC | Status: DC

## 2012-10-02 NOTE — Telephone Encounter (Signed)
Patient complaining of ear pain.  Dr. Patty Sermons looked at ear and Rx'd Cortisporin otic solution, will send to [facility] pharmacy

## 2012-10-15 ENCOUNTER — Ambulatory Visit (INDEPENDENT_AMBULATORY_CARE_PROVIDER_SITE_OTHER): Payer: 59 | Admitting: Internal Medicine

## 2012-10-15 ENCOUNTER — Encounter: Payer: Self-pay | Admitting: Internal Medicine

## 2012-10-15 VITALS — BP 112/64 | HR 84 | Temp 98.4°F | Resp 16 | Wt 145.0 lb

## 2012-10-15 DIAGNOSIS — R5383 Other fatigue: Secondary | ICD-10-CM

## 2012-10-15 DIAGNOSIS — M797 Fibromyalgia: Secondary | ICD-10-CM

## 2012-10-15 DIAGNOSIS — R5381 Other malaise: Secondary | ICD-10-CM

## 2012-10-15 DIAGNOSIS — Z23 Encounter for immunization: Secondary | ICD-10-CM

## 2012-10-15 DIAGNOSIS — IMO0001 Reserved for inherently not codable concepts without codable children: Secondary | ICD-10-CM

## 2012-10-15 MED ORDER — AMPHETAMINE-DEXTROAMPHETAMINE 10 MG PO TABS: 10.0000 mg | ORAL_TABLET | Freq: Two times a day (BID) | ORAL | Status: DC

## 2012-10-15 NOTE — Progress Notes (Signed)
Subjective:    Patient ID: Janice Spencer, female    DOB: 1965/09/16, 47 y.o.   MRN: [id number]  HPI  She returns c/o severe daytime fatigue and sleepiness, she feels like she needs something to get thru the day.  Review of Systems  Constitutional: Positive for fatigue. Negative for fever, chills, diaphoresis, activity change, appetite change and unexpected weight change.  HENT: Negative.   Eyes: Negative.   Respiratory: Negative.  Negative for cough, chest tightness, shortness of breath, wheezing and stridor.   Cardiovascular: Negative.  Negative for chest pain, palpitations and leg swelling.  Gastrointestinal: Negative.  Negative for nausea, vomiting, abdominal pain, diarrhea and constipation.  Endocrine: Negative.   Genitourinary: Negative.   Musculoskeletal: Positive for myalgias. Negative for back pain, joint swelling and gait problem.  Skin: Negative.  Negative for color change, pallor, rash and wound.  Allergic/Immunologic: Negative.   Neurological: Negative.  Negative for dizziness, tremors, weakness, light-headedness and numbness.  Hematological: Negative.  Negative for adenopathy. Does not bruise/bleed easily.  Psychiatric/Behavioral: Positive for sleep disturbance. Negative for suicidal ideas, hallucinations, behavioral problems, confusion, self-injury, decreased concentration and agitation. The patient is not nervous/anxious and is not hyperactive.        Objective:   Physical Exam  Vitals reviewed. Constitutional: She is oriented to person, place, and time. She appears well-developed and well-nourished.  Non-toxic appearance. She does not have a sickly appearance. She does not appear ill. No distress.  HENT:  Head: Normocephalic and atraumatic.  Mouth/Throat: No oropharyngeal exudate.  Eyes: Conjunctivae are normal. Right eye exhibits no discharge. Left eye exhibits no discharge. No scleral icterus.  Neck: Normal range of motion. Neck supple. No JVD present. No  tracheal deviation present. No thyromegaly present.  Cardiovascular: Normal rate, regular rhythm, normal heart sounds and intact distal pulses.  Exam reveals no gallop and no friction rub.   No murmur heard. Pulmonary/Chest: Effort normal and breath sounds normal. No stridor. No respiratory distress. She has no wheezes. She has no rales. She exhibits no tenderness.  Abdominal: Soft. Bowel sounds are normal. She exhibits no distension and no mass. There is no tenderness. There is no rebound and no guarding.  Musculoskeletal: Normal range of motion. She exhibits no edema and no tenderness.  Lymphadenopathy:    She has no cervical adenopathy.  Neurological: She is oriented to person, place, and time.  Skin: Skin is warm and dry. No rash noted. She is not diaphoretic. No erythema. No pallor.  Psychiatric: She has a normal mood and affect. Her speech is normal and behavior is normal. Judgment and thought content normal. Her mood appears not anxious. Her affect is not angry, not labile and not inappropriate. She is not agitated, not aggressive, not hyperactive, not slowed, not withdrawn, not actively hallucinating and not combative. Thought content is not paranoid and not delusional. Cognition and memory are normal. She does not exhibit a depressed mood. She expresses no homicidal and no suicidal ideation. She expresses no suicidal plans and no homicidal plans. She is attentive.     Lab Results  Component Value Date   WBC 6.5 10/18/2011   HGB 14.0 10/18/2011   HCT 41.5 10/18/2011   PLT 224.0 10/18/2011   GLUCOSE 88 10/18/2011   CHOL 199 07/31/2009   TRIG 93.0 07/31/2009   HDL 59.20 07/31/2009   LDLCALC 121* 07/31/2009   ALT 12 10/18/2011   AST 14 10/18/2011   NA 141 10/18/2011   K 4.2 10/18/2011   CL 106 10/18/2011  CREATININE 0.8 10/18/2011   BUN 17 10/18/2011   CO2 28 10/18/2011   TSH 1.07 10/18/2011       Assessment & Plan:

## 2012-10-15 NOTE — Patient Instructions (Addendum)

## 2012-10-15 NOTE — Assessment & Plan Note (Signed)
She will continue the current meds for this

## 2012-10-15 NOTE — Assessment & Plan Note (Signed)
She will try adderall to see if that helps with her symptoms

## 2012-10-19 ENCOUNTER — Other Ambulatory Visit: Payer: Self-pay | Admitting: Surgery

## 2012-10-26 ENCOUNTER — Other Ambulatory Visit: Payer: Self-pay | Admitting: Cardiovascular Disease

## 2012-10-26 ENCOUNTER — Telehealth: Payer: Self-pay | Admitting: Internal Medicine

## 2012-10-26 DIAGNOSIS — K08409 Partial loss of teeth, unspecified cause, unspecified class: Secondary | ICD-10-CM

## 2012-10-26 MED ORDER — CHLORHEXIDINE GLUCONATE 0.12 % MT SOLN: 15.0000 mL | Freq: Two times a day (BID) | OROMUCOSAL | Status: DC

## 2012-10-26 NOTE — Progress Notes (Signed)
Patient recently had a dental extraction.  She is having lots of pain.  Will give her a script for peridex mouth rinse.  Janice Spencer, Janice Hageman., MD, [facility] 10/26/2012, 9:24 AM Office - [phone] Pager [phone]

## 2012-10-26 NOTE — Telephone Encounter (Signed)
Pt states insurance needs PA on adderall, insurance has faxed over PA paperwork and has called. It is yet be done. Please contact patient

## 2012-10-28 NOTE — Telephone Encounter (Signed)
Received fax back from insurance stating PA for adderall is denied due to uncovered diagnosis of fatigue. Will only cover fatigue if associated with MS or chronic use of narcotic analgesics. Pt notified and ask that denial information is faxed to her at [phone]

## 2012-11-19 ENCOUNTER — Encounter: Payer: Self-pay | Admitting: Internal Medicine

## 2013-02-02 ENCOUNTER — Other Ambulatory Visit: Payer: Self-pay | Admitting: Internal Medicine

## 2013-02-08 ENCOUNTER — Other Ambulatory Visit: Payer: Self-pay | Admitting: *Deleted

## 2013-02-08 MED ORDER — PREGABALIN 75 MG PO CAPS: 75.0000 mg | ORAL_CAPSULE | Freq: Two times a day (BID) | ORAL | Status: DC

## 2013-02-08 NOTE — Telephone Encounter (Signed)
Faxed script back to [facility].../lmb 

## 2013-02-17 ENCOUNTER — Ambulatory Visit (INDEPENDENT_AMBULATORY_CARE_PROVIDER_SITE_OTHER): Payer: 59 | Admitting: Internal Medicine

## 2013-02-17 VITALS — BP 122/76 | HR 65 | Temp 98.2°F | Resp 16 | Wt 136.0 lb

## 2013-02-17 DIAGNOSIS — R5383 Other fatigue: Secondary | ICD-10-CM

## 2013-02-17 DIAGNOSIS — H8303 Labyrinthitis, bilateral: Secondary | ICD-10-CM

## 2013-02-17 DIAGNOSIS — R5381 Other malaise: Secondary | ICD-10-CM

## 2013-02-17 DIAGNOSIS — H8309 Labyrinthitis, unspecified ear: Secondary | ICD-10-CM

## 2013-02-17 MED ORDER — AMPHETAMINE-DEXTROAMPHETAMINE 10 MG PO TABS: 10.0000 mg | ORAL_TABLET | Freq: Two times a day (BID) | ORAL | Status: DC

## 2013-02-17 MED ORDER — METHYLPREDNISOLONE ACETATE 80 MG/ML IJ SUSP
120.0000 mg | Freq: Once | INTRAMUSCULAR | Status: AC
  Administered 2013-02-17: 120 mg via INTRAMUSCULAR

## 2013-02-17 MED ORDER — METHYLPREDNISOLONE ACETATE 80 MG/ML IJ SUSP
80.0000 mg | Freq: Once | INTRAMUSCULAR | Status: DC
  Administered 2013-02-17: 80 mg via INTRAMUSCULAR

## 2013-02-17 NOTE — Patient Instructions (Signed)
Labyrinthitis (Inner Ear Inflammation) °Your exam shows you have an inner ear disturbance or labyrinthitis. The cause of this condition is not known. But it may be due to a virus infection. The symptoms of labyrinthitis include vertigo or dizziness made worse by motion, nausea and vomiting. The onset of labyrinthitis may be very sudden. It usually lasts for a few days and then clears up over 1-2 weeks. °The treatment of an inner ear disturbance includes bed rest and medications to reduce dizziness, nausea, and vomiting. You should stay away from alcohol, tranquilizers, caffeine, nicotine, or any medicine your doctor thinks may make your symptoms worse. Further testing may be needed to evaluate your hearing and balance system. Please see your doctor or go to the emergency room right away if you have: °· Increasing vertigo, earache, loss of hearing, or ear drainage. °· Headache, blurred vision, trouble walking, fainting, or fever. °· Persistent vomiting, dehydration, or extreme weakness. °Document Released: 06/24/2005 Document Revised: 09/16/2011 Document Reviewed: 12/10/2006 °[employer]® Patient Information ©2014 [employer]. ° °

## 2013-02-17 NOTE — Progress Notes (Signed)
Subjective:    Patient ID: Janice Spencer, female    DOB: 1965/07/17, 47 y.o.   MRN: [id number]  HPI  She complains of a 3 day history and nausea, dizziness, and vertigo. Today she feels better than she did yesterday.  Review of Systems  Constitutional: Positive for fatigue. Negative for fever, chills, diaphoresis, activity change, appetite change and unexpected weight change.  HENT: Negative.  Negative for hearing loss, ear pain, nosebleeds, sore throat, facial swelling, trouble swallowing, neck pain, neck stiffness and tinnitus.   Eyes: Negative.   Respiratory: Negative.   Cardiovascular: Negative.   Gastrointestinal: Positive for nausea. Negative for vomiting, abdominal pain, diarrhea and constipation.  Endocrine: Negative.   Genitourinary: Negative.   Musculoskeletal: Negative.   Skin: Negative.   Allergic/Immunologic: Negative.   Neurological: Positive for dizziness. Negative for tremors, seizures, syncope, facial asymmetry, speech difficulty, weakness, light-headedness, numbness and headaches.  Hematological: Negative.  Negative for adenopathy. Does not bruise/bleed easily.  Psychiatric/Behavioral: Negative.        Objective:   Physical Exam  Vitals reviewed. Constitutional: She is oriented to person, place, and time. She appears well-developed and well-nourished.  Non-toxic appearance. She does not have a sickly appearance. She does not appear ill. No distress.  HENT:  Head: Normocephalic and atraumatic.  Mouth/Throat: Oropharynx is clear and moist. No oropharyngeal exudate.  Eyes: Conjunctivae and EOM are normal. Pupils are equal, round, and reactive to light. Right eye exhibits no discharge. Left eye exhibits no discharge. No scleral icterus.  Neck: Normal range of motion. Neck supple. No JVD present. No tracheal deviation present. No thyromegaly present.  Cardiovascular: Normal rate, regular rhythm, normal heart sounds and intact distal pulses.  Exam reveals no gallop  and no friction rub.   No murmur heard. Pulmonary/Chest: Effort normal and breath sounds normal. No stridor. No respiratory distress. She has no wheezes. She has no rales. She exhibits no tenderness.  Abdominal: Soft. Bowel sounds are normal. She exhibits no distension and no mass. There is no tenderness. There is no rebound and no guarding.  Musculoskeletal: Normal range of motion. She exhibits no edema and no tenderness.  Lymphadenopathy:    She has no cervical adenopathy.  Neurological: She is alert and oriented to person, place, and time. She has normal strength. She displays no atrophy, no tremor and normal reflexes. No cranial nerve deficit or sensory deficit. She exhibits normal muscle tone. She displays a negative Romberg sign. She displays no seizure activity. Coordination and gait normal. She displays no Babinski's sign on the right side. She displays no Babinski's sign on the left side.  Reflex Scores:      Tricep reflexes are 1+ on the right side and 1+ on the left side.      Bicep reflexes are 1+ on the right side and 1+ on the left side.      Brachioradialis reflexes are 1+ on the right side and 1+ on the left side.      Patellar reflexes are 1+ on the right side and 1+ on the left side.      Achilles reflexes are 1+ on the right side and 1+ on the left side. Skin: Skin is warm and dry. No rash noted. She is not diaphoretic. No erythema. No pallor.  Psychiatric: She has a normal mood and affect. Her behavior is normal. Judgment and thought content normal.     Lab Results  Component Value Date   WBC 6.5 10/18/2011   HGB 14.0 10/18/2011  HCT 41.5 10/18/2011   PLT 224.0 10/18/2011   GLUCOSE 88 10/18/2011   CHOL 199 07/31/2009   TRIG 93.0 07/31/2009   HDL 59.20 07/31/2009   LDLCALC 121* 07/31/2009   ALT 12 10/18/2011   AST 14 10/18/2011   NA 141 10/18/2011   K 4.2 10/18/2011   CL 106 10/18/2011   CREATININE 0.8 10/18/2011   BUN 17 10/18/2011   CO2 28 10/18/2011   TSH 1.07 10/18/2011        Assessment & Plan:

## 2013-02-19 ENCOUNTER — Encounter: Payer: Self-pay | Admitting: Internal Medicine

## 2013-02-19 ENCOUNTER — Ambulatory Visit (INDEPENDENT_AMBULATORY_CARE_PROVIDER_SITE_OTHER): Payer: 59 | Admitting: Internal Medicine

## 2013-02-19 ENCOUNTER — Telehealth: Payer: Self-pay | Admitting: Internal Medicine

## 2013-02-19 VITALS — BP 102/70 | HR 70 | Temp 98.7°F | Ht 64.0 in | Wt 137.4 lb

## 2013-02-19 DIAGNOSIS — H6690 Otitis media, unspecified, unspecified ear: Secondary | ICD-10-CM

## 2013-02-19 DIAGNOSIS — H6691 Otitis media, unspecified, right ear: Secondary | ICD-10-CM | POA: Insufficient documentation

## 2013-02-19 MED ORDER — LEVOFLOXACIN 250 MG PO TABS: 250.0000 mg | ORAL_TABLET | Freq: Every day | ORAL | Status: DC

## 2013-02-19 NOTE — Progress Notes (Signed)
Subjective:    Patient ID: Janice Spencer, female    DOB: 11/06/1965, 47 y.o.   MRN: [id number]  HPI  Here to f/u with further symptoms since last seen aug 13, tx for viral ear infection with depomedrol;  Seemed improved that night, but since then has unfortunately worsened right ear pain, feeling warm, still occasionally dizzy (though less frequent), as well as feeling more ill, with diffuse HA and neck sharp pains with movement;  No specific sinus or ST, no cough and Pt denies chest pain, increased sob or doe, wheezing, orthopnea, PND, increased LE swelling, palpitations, dizziness or syncope.  Pt denies new neurological symptoms such as new headache, or facial or extremity weakness or numbness Past Medical History  Diagnosis Date  . Endometriosis   . IBS (irritable bowel syndrome)   . Anxiety   . Depression    Past Surgical History  Procedure Laterality Date  . Vaginal hysterectomy    . Cholecystectomy    . Nasal bone spur removed      reports that she quit smoking about 4 years ago. She has never used smokeless tobacco. She reports that she drinks about 1.2 ounces of alcohol per week. She reports that she does not use illicit drugs. family history includes Heart disease in her father; Hypertension in her mother. There is no history of Cancer, Alcohol abuse, or Stroke. Allergies  Allergen Reactions  . Cymbalta [Duloxetine Hcl]     syncope  . Codeine   . Morphine   . Penicillins    Current Outpatient Prescriptions on File Prior to Visit  Medication Sig Dispense Refill  . amphetamine-dextroamphetamine (ADDERALL) 10 MG tablet Take 1 tablet (10 mg total) by mouth 2 (two) times daily.  60 tablet  0  . clonazePAM (KLONOPIN) 1 MG tablet Take 1 mg by mouth at bedtime as needed.        . pregabalin (LYRICA) 75 MG capsule Take 1 capsule (75 mg total) by mouth 2 (two) times daily.  180 capsule  1  . traMADol (ULTRAM) 50 MG tablet Take 50 mg by mouth as needed for pain.       No current  facility-administered medications on file prior to visit.   Review of Systems  Constitutional: Negative for unexpected weight change, or unusual diaphoresis  HENT: Negative for tinnitus.   Eyes: Negative for photophobia and visual disturbance.  Respiratory: Negative for choking and stridor.   Gastrointestinal: Negative for vomiting and blood in stool.  Genitourinary: Negative for hematuria and decreased urine volume.  Musculoskeletal: Negative for acute joint swelling Skin: Negative for color change and wound.  Neurological: Negative for tremors and numbness other than noted  Psychiatric/Behavioral: Negative for decreased concentration or  hyperactivity.       Objective:   Physical Exam BP 102/70  Pulse 70  Temp(Src) 98.7 F (37.1 C) (Oral)  Ht 5\' 4"  (1.626 m)  Wt 137 lb 6 oz (62.313 kg)  BMI 23.57 kg/m2  SpO2 94% VS noted,  Constitutional: Pt appears well-developed and well-nourished.  HENT: Head: NCAT.  Right Ear: External ear normal.  Left Ear: External ear normal.  Bilat tm's with mild erythema with right >> left , with effusion.  Max sinus areas non tender.  Pharynx with mild erythema, no exudate Eyes: Conjunctivae and EOM are normal. Pupils are equal, round, and reactive to light.  Neck: Normal range of motion. Neck supple.  Cardiovascular: Normal rate and regular rhythm.   Pulmonary/Chest: Effort normal and breath sounds normal. -  no rales Neurological: Pt is alert. Not confused  Skin: Skin is warm. No erythema.  Psychiatric: Pt behavior is normal. Thought content normal.     Assessment & Plan:

## 2013-02-19 NOTE — Telephone Encounter (Signed)
Patient Information:  Caller Name: Jodeci  Phone: [phone]  Patient: Janice Spencer, Janice Spencer  Gender: Female  DOB: 20-Jul-1965  Age: 47 Years  PCP: Sanda Linger (Adults only)  Pregnant: No  Office Follow Up:  Does the office need to follow up with this patient?: No  Instructions For The Office: N/A  RN Note:  Right ear pain intermittently.  Called from work. Dizziness is tremndously improved. Right ear pain rated 4/10. Requested appoinment after 1430 due to work.   Symptoms  Reason For Call & Symptoms: Intermittent right ear pain with dull ache in right side of neck. Seen 02/17/13; diagnosed with layrinthitis.  Reviewed Health History In EMR: Yes  Reviewed Medications In EMR: Yes  Reviewed Allergies In EMR: Yes  Reviewed Surgeries / Procedures: Yes  Date of Onset of Symptoms: 02/19/2013 OB / GYN:  LMP: Unknown  Guideline(s) Used:  Earache  Disposition Per Guideline:   See Today in Office  Reason For Disposition Reached:   All other earaches (Exceptions: earache lasting < 1 hour, and earache from air travel)  Advice Given:  Pain Medicines:  For pain relief, you can take either acetaminophen, ibuprofen, or naproxen.  Ibuprofen (e.g., Motrin, Advil):  Take 400 mg (two 200 mg pills) by mouth every 6 hours.  Another choice is to take 600 mg (three 200 mg pills) by mouth every 8 hours.  The most you should take each day is 1,200 mg (six 200 mg pills), unless your doctor has told you to take more.  Call Back If  High fever, severe headache, or stiff neck occurs  You become worse.  Patient Will Follow Care Advice:  YES  Appointment Scheduled:  02/19/2013 15:30:00 Appointment Scheduled Provider:  Oliver Barre (Adults only)

## 2013-02-19 NOTE — Patient Instructions (Signed)
Please take all new medication as prescribed Please continue all other medications as before, and refills have been done if requested. You can also take Delsym OTC for cough, and/or Mucinex (or it's generic off brand) for congestion, and tylenol as needed for pain.

## 2013-02-21 ENCOUNTER — Encounter: Payer: Self-pay | Admitting: Internal Medicine

## 2013-02-21 NOTE — Assessment & Plan Note (Signed)
Will treat with depo-medrol IM She was given pt ed material as well

## 2013-02-21 NOTE — Assessment & Plan Note (Signed)
Mild to mod, for antibx course,  to f/u any worsening symptoms or concerns 

## 2013-02-23 DIAGNOSIS — Z0279 Encounter for issue of other medical certificate: Secondary | ICD-10-CM

## 2013-03-02 ENCOUNTER — Telehealth: Payer: Self-pay | Admitting: *Deleted

## 2013-03-02 MED ORDER — FLUCONAZOLE 150 MG PO TABS: 150.0000 mg | ORAL_TABLET | Freq: Once | ORAL | Status: DC

## 2013-03-02 NOTE — Telephone Encounter (Signed)
Pt called states since the regiment of Levaquin she now has a "raging yeast infection"  Requesting treatment.  Please advise

## 2013-03-02 NOTE — Telephone Encounter (Signed)
Patient called in , informed of Dr. Yetta Barre recommendation pt voiced understanding

## 2013-03-02 NOTE — Telephone Encounter (Signed)
   Start diflucan

## 2013-07-22 ENCOUNTER — Telehealth: Payer: Self-pay | Admitting: *Deleted

## 2013-07-22 NOTE — Telephone Encounter (Signed)
Patient phoned requesting refills for her Tramadol and her Adderall.  Last OV with PCP 02/19/13.  Also, she is requesting an adjustment for her firbromyalgia medication (Lyrica 75 mg qd), stating it is ineffective.  Please advise.   CB# [phone]

## 2013-07-22 NOTE — Telephone Encounter (Signed)
Notified pt of MD response.  Transferred to scheduling for appt

## 2013-07-22 NOTE — Telephone Encounter (Signed)
She has to be seen for refills on these meds

## 2013-07-22 NOTE — Telephone Encounter (Signed)
Pt scheduled for Tuesday morning.   Thanks!

## 2013-07-27 ENCOUNTER — Ambulatory Visit (INDEPENDENT_AMBULATORY_CARE_PROVIDER_SITE_OTHER): Payer: 59 | Admitting: Internal Medicine

## 2013-07-27 ENCOUNTER — Encounter: Payer: Self-pay | Admitting: Internal Medicine

## 2013-07-27 VITALS — BP 110/64 | HR 80 | Temp 98.5°F | Resp 16 | Ht 64.0 in | Wt 134.0 lb

## 2013-07-27 DIAGNOSIS — M542 Cervicalgia: Secondary | ICD-10-CM | POA: Insufficient documentation

## 2013-07-27 DIAGNOSIS — F329 Major depressive disorder, single episode, unspecified: Secondary | ICD-10-CM

## 2013-07-27 DIAGNOSIS — M797 Fibromyalgia: Secondary | ICD-10-CM

## 2013-07-27 DIAGNOSIS — F3289 Other specified depressive episodes: Secondary | ICD-10-CM

## 2013-07-27 DIAGNOSIS — IMO0001 Reserved for inherently not codable concepts without codable children: Secondary | ICD-10-CM

## 2013-07-27 DIAGNOSIS — R5381 Other malaise: Secondary | ICD-10-CM

## 2013-07-27 DIAGNOSIS — R5383 Other fatigue: Secondary | ICD-10-CM

## 2013-07-27 MED ORDER — PREGABALIN 75 MG PO CAPS: 75.0000 mg | ORAL_CAPSULE | Freq: Two times a day (BID) | ORAL | Status: DC

## 2013-07-27 MED ORDER — TRAMADOL HCL 50 MG PO TABS: 50.0000 mg | ORAL_TABLET | Freq: Four times a day (QID) | ORAL | Status: DC | PRN

## 2013-07-27 MED ORDER — AMPHETAMINE-DEXTROAMPHETAMINE 10 MG PO TABS: 10.0000 mg | ORAL_TABLET | Freq: Two times a day (BID) | ORAL | Status: DC

## 2013-07-27 NOTE — Patient Instructions (Signed)

## 2013-07-27 NOTE — Assessment & Plan Note (Signed)
She will continue adderall as needed

## 2013-07-27 NOTE — Progress Notes (Signed)
Subjective:    Patient ID: Janice Spencer, female    DOB: 1966/04/25, 48 y.o.   MRN: [id number]  Neck Pain  This is a chronic problem. The current episode started more than 1 year ago. The problem occurs intermittently. The problem has been gradually worsening. The pain is associated with nothing. The pain is present in the right side. The quality of the pain is described as aching. The pain is at a severity of 3/10. The pain is mild. The symptoms are aggravated by position. Associated symptoms include numbness (in her right arm) and tingling (in her right arm). Pertinent negatives include no chest pain, fever, headaches, leg pain, pain with swallowing, paresis, photophobia, syncope, trouble swallowing, visual change, weakness or weight loss. She has tried NSAIDs and oral narcotics for the symptoms. The treatment provided moderate relief.      Review of Systems  Constitutional: Positive for fatigue. Negative for fever, chills, weight loss, diaphoresis and appetite change.  HENT: Negative.  Negative for trouble swallowing.   Eyes: Negative.  Negative for photophobia.  Respiratory: Negative.  Negative for cough, chest tightness, shortness of breath, wheezing and stridor.   Cardiovascular: Negative.  Negative for chest pain, palpitations, leg swelling and syncope.  Gastrointestinal: Negative.  Negative for nausea, vomiting, abdominal pain, diarrhea, constipation and blood in stool.  Endocrine: Negative.   Genitourinary: Negative.   Musculoskeletal: Positive for myalgias and neck pain. Negative for arthralgias, back pain, gait problem, joint swelling and neck stiffness.  Skin: Negative.   Neurological: Positive for tingling (in her right arm) and numbness (in her right arm). Negative for dizziness, weakness and headaches.  Hematological: Negative.  Negative for adenopathy. Does not bruise/bleed easily.  Psychiatric/Behavioral: Negative.        Objective:   Physical Exam  Vitals  reviewed. Constitutional: She is oriented to person, place, and time. She appears well-developed and well-nourished. No distress.  HENT:  Head: Normocephalic and atraumatic.  Mouth/Throat: Oropharynx is clear and moist. No oropharyngeal exudate.  Eyes: Conjunctivae are normal. Right eye exhibits no discharge. Left eye exhibits no discharge. No scleral icterus.  Neck: Normal range of motion. Neck supple. No JVD present. No tracheal deviation present. No thyromegaly present.  Cardiovascular: Normal rate, regular rhythm, normal heart sounds and intact distal pulses.  Exam reveals no gallop and no friction rub.   No murmur heard. Pulmonary/Chest: Effort normal and breath sounds normal. No stridor. No respiratory distress. She has no wheezes. She has no rales. She exhibits no tenderness.  Abdominal: Soft. Bowel sounds are normal. She exhibits no distension and no mass. There is no tenderness. There is no rebound and no guarding.  Musculoskeletal: She exhibits no edema and no tenderness.  Lymphadenopathy:    She has no cervical adenopathy.  Neurological: She is alert and oriented to person, place, and time. She has normal strength. She displays abnormal reflex. She displays no atrophy and no tremor. No cranial nerve deficit or sensory deficit. She exhibits normal muscle tone. She displays a negative Romberg sign. She displays no seizure activity. Coordination and gait normal.  Reflex Scores:      Tricep reflexes are 0 on the right side and 1+ on the left side.      Bicep reflexes are 0 on the right side and 1+ on the left side.      Brachioradialis reflexes are 0 on the right side and 1+ on the left side.      Patellar reflexes are 1+ on the right  side and 1+ on the left side.      Achilles reflexes are 1+ on the right side and 1+ on the left side. Skin: Skin is warm and dry. No rash noted. She is not diaphoretic. No erythema. No pallor.     Lab Results  Component Value Date   WBC 6.5 10/18/2011    HGB 14.0 10/18/2011   HCT 41.5 10/18/2011   PLT 224.0 10/18/2011   GLUCOSE 88 10/18/2011   CHOL 199 07/31/2009   TRIG 93.0 07/31/2009   HDL 59.20 07/31/2009   LDLCALC 121* 07/31/2009   ALT 12 10/18/2011   AST 14 10/18/2011   NA 141 10/18/2011   K 4.2 10/18/2011   CL 106 10/18/2011   CREATININE 0.8 10/18/2011   BUN 17 10/18/2011   CO2 28 10/18/2011   TSH 1.07 10/18/2011       Assessment & Plan:

## 2013-07-27 NOTE — Progress Notes (Signed)
Pre visit review using our clinic review tool, if applicable. No additional management support is needed unless otherwise documented below in the visit note. 

## 2013-07-27 NOTE — Assessment & Plan Note (Signed)
Cont lyrica and tramadol as needed for pain

## 2013-07-27 NOTE — Assessment & Plan Note (Signed)
I have ordered an MRI to see if there is pathology in her C-spine that is causing her symptoms

## 2013-08-03 ENCOUNTER — Other Ambulatory Visit: Payer: Self-pay

## 2013-09-08 ENCOUNTER — Other Ambulatory Visit: Payer: Self-pay | Admitting: *Deleted

## 2013-09-08 MED ORDER — CIPROFLOXACIN HCL 250 MG PO TABS: 250.0000 mg | ORAL_TABLET | Freq: Two times a day (BID) | ORAL | Status: DC

## 2013-09-08 MED ORDER — FLUCONAZOLE 150 MG PO TABS: 150.0000 mg | ORAL_TABLET | Freq: Once | ORAL | Status: DC

## 2013-09-16 ENCOUNTER — Telehealth: Payer: Self-pay | Admitting: *Deleted

## 2013-09-16 NOTE — Telephone Encounter (Signed)
Spoke with pt advised of MDs message 

## 2013-09-16 NOTE — Telephone Encounter (Signed)
She needs to be seen and to have a UA and urine clx done

## 2013-09-16 NOTE — Telephone Encounter (Signed)
Patient phoned c/o UTI sxs---urinalysis? ABX?  Patient states she's already been on 2 different abx. Please advise.  CB# [phone]

## 2013-09-17 ENCOUNTER — Ambulatory Visit (INDEPENDENT_AMBULATORY_CARE_PROVIDER_SITE_OTHER): Payer: 59 | Admitting: Internal Medicine

## 2013-09-17 ENCOUNTER — Encounter: Payer: Self-pay | Admitting: Internal Medicine

## 2013-09-17 ENCOUNTER — Other Ambulatory Visit (INDEPENDENT_AMBULATORY_CARE_PROVIDER_SITE_OTHER): Payer: 59

## 2013-09-17 VITALS — BP 118/70 | HR 87 | Temp 99.5°F | Resp 16 | Ht 64.0 in | Wt 131.0 lb

## 2013-09-17 DIAGNOSIS — N390 Urinary tract infection, site not specified: Secondary | ICD-10-CM

## 2013-09-17 LAB — URINALYSIS, ROUTINE W REFLEX MICROSCOPIC
Bilirubin Urine: NEGATIVE
KETONES UR: NEGATIVE
Nitrite: NEGATIVE
Specific Gravity, Urine: <=1.005 — AB (ref 1.000–1.030)
Total Protein, Urine: NEGATIVE
URINE GLUCOSE: NEGATIVE
Urobilinogen, UA: 0.2 (ref 0.0–1.0)
pH: 6.5 (ref 5.0–8.0)

## 2013-09-17 MED ORDER — SULFAMETHOXAZOLE-TRIMETHOPRIM 800-160 MG PO TABS: 1.0000 | ORAL_TABLET | Freq: Two times a day (BID) | ORAL | Status: DC

## 2013-09-17 NOTE — Patient Instructions (Signed)
Urinary Tract Infection  Urinary tract infections (UTIs) can develop anywhere along your urinary tract. Your urinary tract is your body's drainage system for removing wastes and extra water. Your urinary tract includes two kidneys, two ureters, a bladder, and a urethra. Your kidneys are a pair of bean-shaped organs. Each kidney is about the size of your fist. They are located below your ribs, one on each side of your spine.  CAUSES  Infections are caused by microbes, which are microscopic organisms, including fungi, viruses, and bacteria. These organisms are so small that they can only be seen through a microscope. Bacteria are the microbes that most commonly cause UTIs.  SYMPTOMS   Symptoms of UTIs may vary by age and gender of the patient and by the location of the infection. Symptoms in young women typically include a frequent and intense urge to urinate and a painful, burning feeling in the bladder or urethra during urination. Older women and men are more likely to be tired, shaky, and weak and have muscle aches and abdominal pain. A fever may mean the infection is in your kidneys. Other symptoms of a kidney infection include pain in your back or sides below the ribs, nausea, and vomiting.  DIAGNOSIS  To diagnose a UTI, your caregiver will ask you about your symptoms. Your caregiver also will ask to provide a urine sample. The urine sample will be tested for bacteria and white blood cells. White blood cells are made by your body to help fight infection.  TREATMENT   Typically, UTIs can be treated with medication. Because most UTIs are caused by a bacterial infection, they usually can be treated with the use of antibiotics. The choice of antibiotic and length of treatment depend on your symptoms and the type of bacteria causing your infection.  HOME CARE INSTRUCTIONS   If you were prescribed antibiotics, take them exactly as your caregiver instructs you. Finish the medication even if you feel better after you  have only taken some of the medication.   Drink enough water and fluids to keep your urine clear or pale yellow.   Avoid caffeine, tea, and carbonated beverages. They tend to irritate your bladder.   Empty your bladder often. Avoid holding urine for long periods of time.   Empty your bladder before and after sexual intercourse.   After a bowel movement, women should cleanse from front to back. Use each tissue only once.  SEEK MEDICAL CARE IF:    You have back pain.   You develop a fever.   Your symptoms do not begin to resolve within 3 days.  SEEK IMMEDIATE MEDICAL CARE IF:    You have severe back pain or lower abdominal pain.   You develop chills.   You have nausea or vomiting.   You have continued burning or discomfort with urination.  MAKE SURE YOU:    Understand these instructions.   Will watch your condition.   Will get help right away if you are not doing well or get worse.  Document Released: 04/03/2005 Document Revised: 12/24/2011 Document Reviewed: 08/02/2011  [employer] Patient Information 2014 [employer]

## 2013-09-17 NOTE — Progress Notes (Signed)
Pre visit review using our clinic review tool, if applicable. No additional management support is needed unless otherwise documented below in the visit note. 

## 2013-09-19 NOTE — Assessment & Plan Note (Signed)
She has tried cipro with only minimal improvement I have ordered a urine clx Will treat with bactrim-ds

## 2013-09-19 NOTE — Progress Notes (Signed)
   Subjective:    Patient ID: Janice Spencer, female    DOB: [birth year], 48 y.o.   MRN: [id number]  Dysuria  This is a recurrent problem. The current episode started 1 to 4 weeks ago. The problem occurs intermittently. The problem has been unchanged. The quality of the pain is described as burning. The pain is at a severity of 1/10. The patient is experiencing no pain. There has been no fever. The fever has been present for less than 1 day. She is not sexually active. There is no history of pyelonephritis. Associated symptoms include frequency, hematuria and urgency. Pertinent negatives include no chills, discharge, flank pain, hesitancy, nausea, possible pregnancy, sweats or vomiting. She has tried antibiotics for the symptoms. The treatment provided mild relief. There is no history of catheterization, kidney stones, recurrent UTIs, a single kidney, urinary stasis or a urological procedure.      Review of Systems  Constitutional: Negative.  Negative for fever, chills, diaphoresis, appetite change and fatigue.  HENT: Negative.   Eyes: Negative.   Respiratory: Negative.  Negative for cough, choking, chest tightness, shortness of breath and stridor.   Cardiovascular: Negative.  Negative for chest pain, palpitations and leg swelling.  Gastrointestinal: Negative.  Negative for nausea, vomiting, abdominal pain, diarrhea and constipation.  Endocrine: Negative.   Genitourinary: Positive for dysuria, urgency, frequency and hematuria. Negative for hesitancy, flank pain, decreased urine volume, vaginal bleeding, vaginal discharge, enuresis, difficulty urinating, genital sores, vaginal pain, menstrual problem, pelvic pain and dyspareunia.  Musculoskeletal: Negative.   Skin: Negative.   Allergic/Immunologic: Negative.   Neurological: Negative.   Hematological: Negative.  Negative for adenopathy. Does not bruise/bleed easily.  Psychiatric/Behavioral: Negative.        Objective:   Physical Exam    Vitals reviewed. Constitutional: She is oriented to person, place, and time. She appears well-developed and well-nourished.  Non-toxic appearance. She does not have a sickly appearance. She does not appear ill. No distress.  HENT:  Head: Normocephalic and atraumatic.  Mouth/Throat: Oropharynx is clear and moist. No oropharyngeal exudate.  Eyes: Conjunctivae are normal. Right eye exhibits no discharge. Left eye exhibits no discharge. No scleral icterus.  Neck: Normal range of motion. Neck supple. No JVD present. No tracheal deviation present. No thyromegaly present.  Cardiovascular: Normal rate, regular rhythm, normal heart sounds and intact distal pulses.  Exam reveals no gallop and no friction rub.   No murmur heard. Pulmonary/Chest: Effort normal and breath sounds normal. No stridor. No respiratory distress. She has no wheezes. She has no rales. She exhibits no tenderness.  Abdominal: Soft. Normal appearance and bowel sounds are normal. She exhibits no distension and no mass. There is no hepatosplenomegaly. There is no tenderness. There is no rebound, no guarding and no CVA tenderness.  Musculoskeletal: Normal range of motion. She exhibits no edema and no tenderness.  Lymphadenopathy:    She has no cervical adenopathy.  Neurological: She is oriented to person, place, and time.  Skin: Skin is warm and dry. No rash noted. She is not diaphoretic. No erythema. No pallor.  Psychiatric: She has a normal mood and affect. Her behavior is normal. Judgment and thought content normal.          Assessment & Plan:

## 2013-09-20 ENCOUNTER — Encounter: Payer: Self-pay | Admitting: Internal Medicine

## 2013-09-20 ENCOUNTER — Other Ambulatory Visit: Payer: Self-pay | Admitting: Internal Medicine

## 2013-09-20 DIAGNOSIS — N390 Urinary tract infection, site not specified: Secondary | ICD-10-CM

## 2013-09-20 LAB — CULTURE, URINE COMPREHENSIVE: Colony Count: >=100000

## 2013-09-20 MED ORDER — NITROFURANTOIN MONOHYD MACRO 100 MG PO CAPS
100.0000 mg | ORAL_CAPSULE | Freq: Two times a day (BID) | ORAL | Status: AC
Start: 2013-09-20 — End: 2013-09-25

## 2014-03-02 ENCOUNTER — Encounter: Payer: Self-pay | Admitting: Gastroenterology

## 2014-03-02 ENCOUNTER — Encounter: Payer: Self-pay | Admitting: Internal Medicine

## 2014-03-23 LAB — HM MAMMOGRAPHY: HM Mammogram: NORMAL

## 2014-04-12 IMAGING — CR DG CERVICAL SPINE COMPLETE 4+V
6 series · 6 of 6 positions shown · non-contrast
Comparison: No priors.

CLINICAL DATA: Neck pain.

CERVICAL SPINE - COMPLETE 4+ VIEW

[view not recorded (1 of 6)]
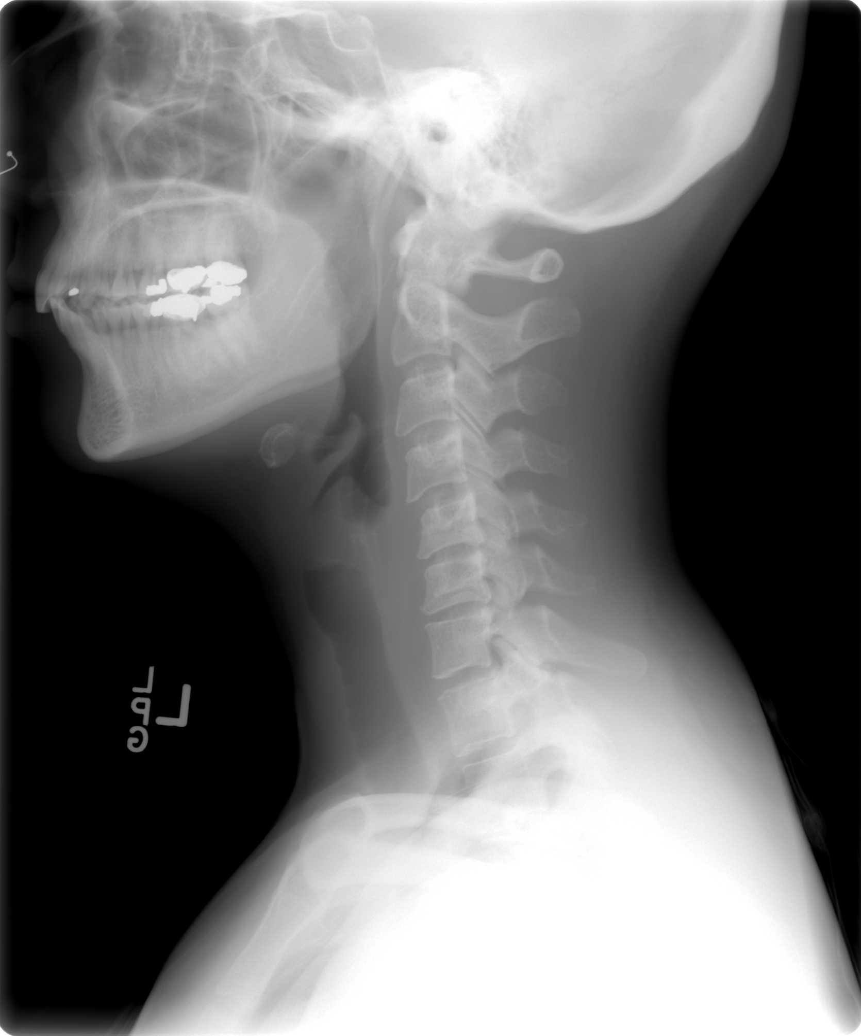

[view not recorded (2 of 6)]
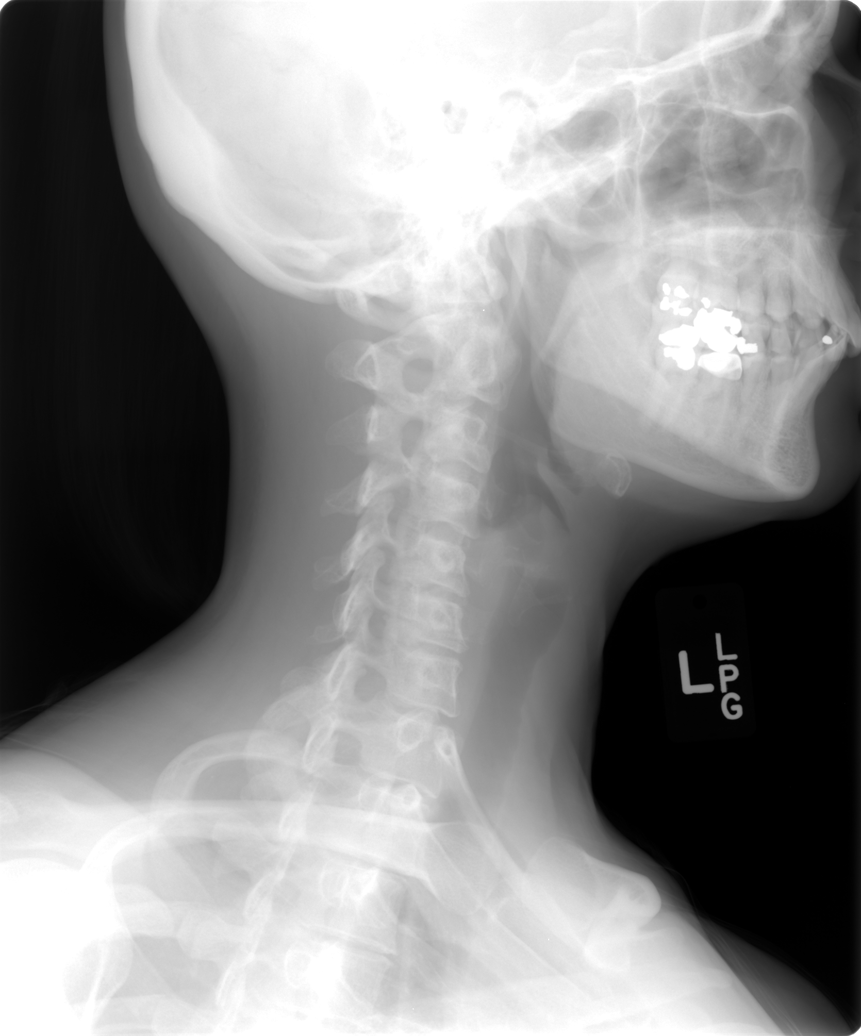

[view not recorded (3 of 6)]
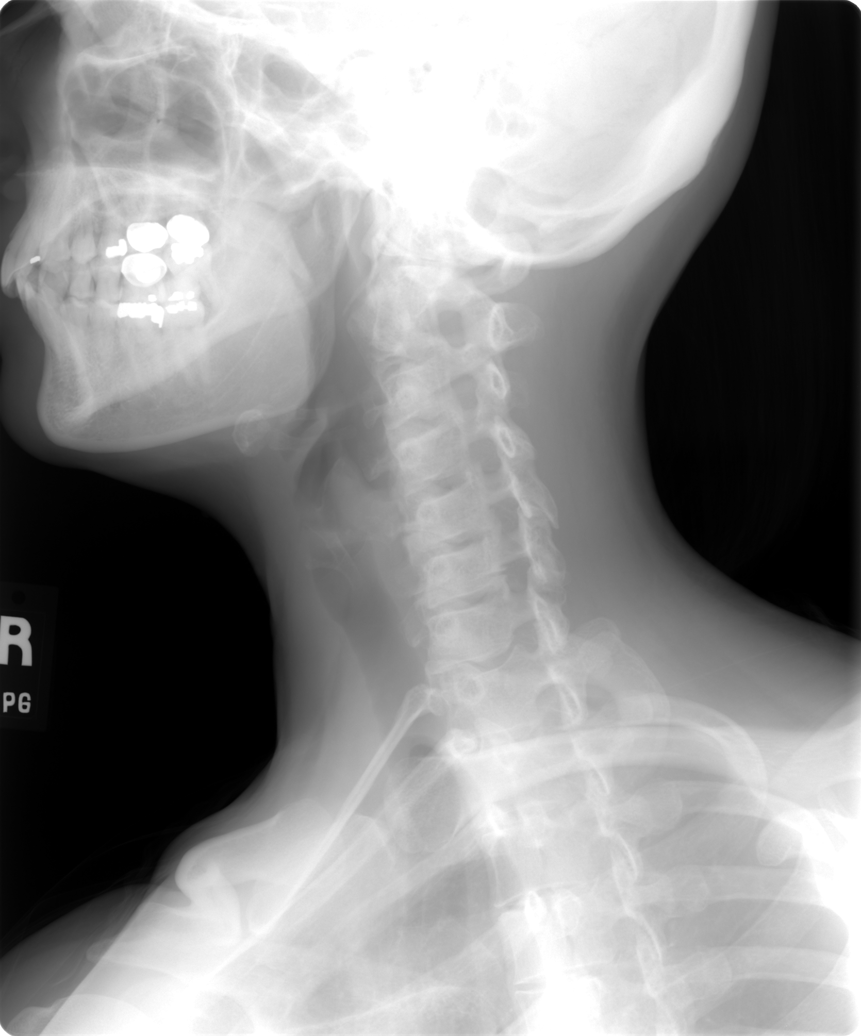

[view not recorded (4 of 6)]
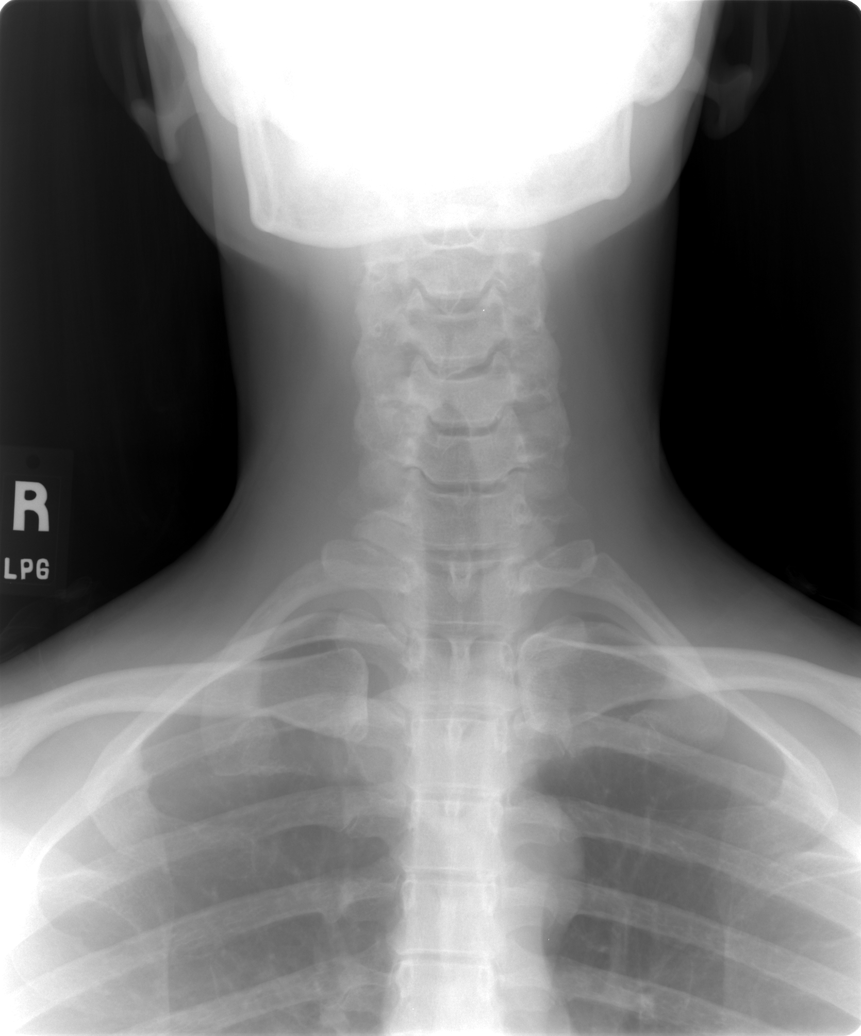

[view not recorded (5 of 6)]
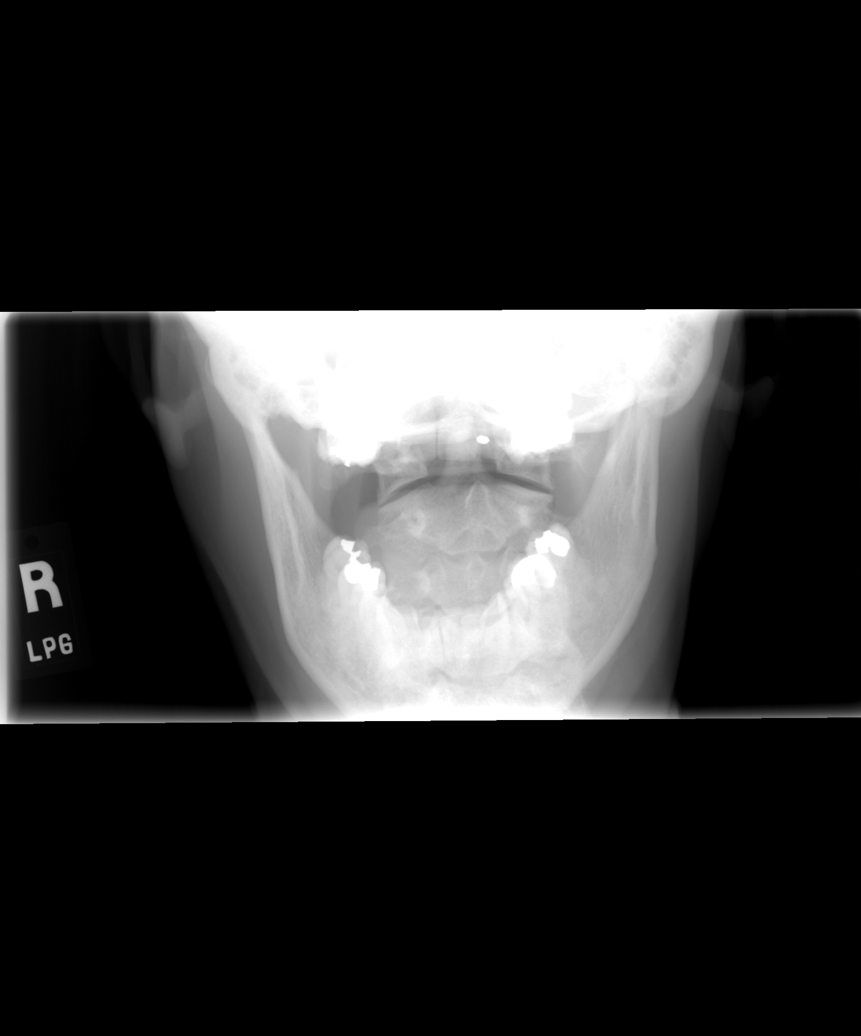

[view not recorded (6 of 6)]
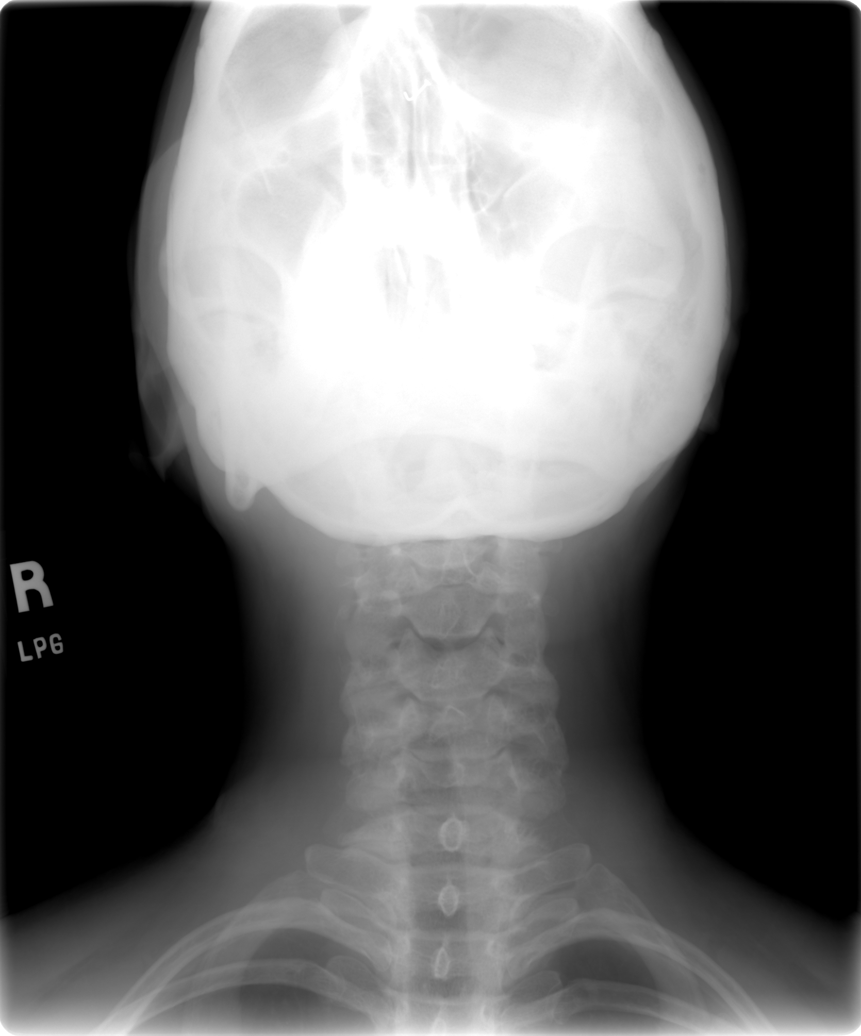

[6 of 6 positions shown; findings below may reference images not displayed]

FINDINGS: Six views of the cervical spine demonstrate no acute
displaced fracture or compression type fracture.  There is mild
multilevel degenerative disc disease and facet arthropathy.
Degenerative disc disease is most pronounced at C6-C7, there is
mild foraminal narrowing on the right at the level of C4-C5.
Prevertebral soft tissues are normal.
IMPRESSION: 1.  No acute radiographic abnormality of the cervical spine.
2.  Multilevel degenerative disc disease and cervical spondylosis,
as above.

## 2014-07-18 ENCOUNTER — Ambulatory Visit (INDEPENDENT_AMBULATORY_CARE_PROVIDER_SITE_OTHER): Payer: 59 | Admitting: Internal Medicine

## 2014-07-18 ENCOUNTER — Encounter: Payer: Self-pay | Admitting: Internal Medicine

## 2014-07-18 ENCOUNTER — Other Ambulatory Visit (INDEPENDENT_AMBULATORY_CARE_PROVIDER_SITE_OTHER): Payer: 59

## 2014-07-18 VITALS — BP 106/64 | HR 90 | Temp 98.7°F | Resp 16 | Ht 64.0 in | Wt 126.0 lb

## 2014-07-18 DIAGNOSIS — Z0000 Encounter for general adult medical examination without abnormal findings: Secondary | ICD-10-CM | POA: Insufficient documentation

## 2014-07-18 DIAGNOSIS — M542 Cervicalgia: Secondary | ICD-10-CM

## 2014-07-18 DIAGNOSIS — M797 Fibromyalgia: Secondary | ICD-10-CM

## 2014-07-18 LAB — LIPID PANEL
CHOL/HDL RATIO: 3
CHOLESTEROL: 195 mg/dL (ref 0–200)
HDL: 60 mg/dL (ref >39.00)
LDL Cholesterol: 119 mg/dL — ABNORMAL HIGH (ref 0–99)
NonHDL: 135
TRIGLYCERIDES: 81 mg/dL (ref 0.0–149.0)
VLDL: 16.2 mg/dL (ref 0.0–40.0)

## 2014-07-18 LAB — COMPREHENSIVE METABOLIC PANEL
ALBUMIN: 4.1 g/dL (ref 3.5–5.2)
ALK PHOS: 45 U/L (ref 39–117)
ALT: 11 U/L (ref 0–35)
AST: 15 U/L (ref 0–37)
BILIRUBIN TOTAL: 0.5 mg/dL (ref 0.2–1.2)
BUN: 14 mg/dL (ref 6–23)
CHLORIDE: 107 meq/L (ref 96–112)
CO2: 26 mEq/L (ref 19–32)
CREATININE: 0.9 mg/dL (ref 0.4–1.2)
Calcium: 8.8 mg/dL (ref 8.4–10.5)
GFR: 70.95 mL/min (ref >60.00)
Glucose, Bld: 112 mg/dL — ABNORMAL HIGH (ref 70–99)
Potassium: 4 mEq/L (ref 3.5–5.1)
SODIUM: 140 meq/L (ref 135–145)
Total Protein: 6.7 g/dL (ref 6.0–8.3)

## 2014-07-18 LAB — CBC WITH DIFFERENTIAL/PLATELET
Basophils Absolute: 0 10*3/uL (ref 0.0–0.1)
Basophils Relative: 0.2 % (ref 0.0–3.0)
Eosinophils Absolute: 0.1 10*3/uL (ref 0.0–0.7)
Eosinophils Relative: 1.1 % (ref 0.0–5.0)
HCT: 42.2 % (ref 36.0–46.0)
Hemoglobin: 14.2 g/dL (ref 12.0–15.0)
LYMPHS PCT: 29.7 % (ref 12.0–46.0)
Lymphs Abs: 2.4 10*3/uL (ref 0.7–4.0)
MCHC: 33.6 g/dL (ref 30.0–36.0)
MCV: 90.5 fl (ref 78.0–100.0)
MONO ABS: 0.5 10*3/uL (ref 0.1–1.0)
Monocytes Relative: 6.2 % (ref 3.0–12.0)
NEUTROS ABS: 5.1 10*3/uL (ref 1.4–7.7)
Neutrophils Relative %: 62.8 % (ref 43.0–77.0)
PLATELETS: 230 10*3/uL (ref 150.0–400.0)
RBC: 4.66 Mil/uL (ref 3.87–5.11)
RDW: 13.1 % (ref 11.5–15.5)
WBC: 8.2 10*3/uL (ref 4.0–10.5)

## 2014-07-18 LAB — TSH: TSH: 0.62 u[IU]/mL (ref 0.35–4.50)

## 2014-07-18 MED ORDER — CYCLOBENZAPRINE HCL 5 MG PO TABS: 5.0000 mg | ORAL_TABLET | Freq: Three times a day (TID) | ORAL | Status: DC | PRN

## 2014-07-18 NOTE — Progress Notes (Signed)
Subjective:    Patient ID: Janice Spencer, female    DOB: [birth year], 49 y.o.   MRN: [id number]  HPI Comments: She returns for a CPX but also complains of pain and spasms over her mid and lower back for several months, the pain will go away with a massage but then returns. She wants to try a med and PT for this. There has not been any recent injury or trauma.     Review of Systems  Constitutional: Negative.  Negative for fever, chills, diaphoresis, appetite change and fatigue.  HENT: Negative.   Eyes: Negative.   Respiratory: Negative.  Negative for apnea, cough, choking, chest tightness, shortness of breath, wheezing and stridor.   Cardiovascular: Negative.  Negative for chest pain, palpitations and leg swelling.  Gastrointestinal: Negative.  Negative for nausea, vomiting, abdominal pain, diarrhea, constipation and blood in stool.  Endocrine: Negative.   Genitourinary: Negative.   Musculoskeletal: Positive for back pain and neck pain. Negative for myalgias, joint swelling, arthralgias, gait problem and neck stiffness.  Skin: Negative.  Negative for rash.  Allergic/Immunologic: Negative.   Neurological: Negative.   Hematological: Negative.  Negative for adenopathy. Does not bruise/bleed easily.  Psychiatric/Behavioral: Negative.        Objective:   Physical Exam  Constitutional: She is oriented to person, place, and time. She appears well-developed and well-nourished. No distress.  HENT:  Head: Normocephalic and atraumatic.  Mouth/Throat: Oropharynx is clear and moist. No oropharyngeal exudate.  Eyes: Conjunctivae are normal. Right eye exhibits no discharge. Left eye exhibits no discharge. No scleral icterus.  Neck: Normal range of motion. Neck supple. No JVD present. No tracheal deviation present. No thyromegaly present.  Cardiovascular: Normal rate, regular rhythm, normal heart sounds and intact distal pulses.  Exam reveals no gallop and no friction rub.   No murmur  heard. Pulmonary/Chest: Effort normal and breath sounds normal. No stridor. No respiratory distress. She has no wheezes. She has no rales. She exhibits no tenderness.  Abdominal: Soft. Bowel sounds are normal. She exhibits no distension and no mass. There is no tenderness. There is no rebound and no guarding.  Musculoskeletal: Normal range of motion. She exhibits no edema or tenderness.       Thoracic back: She exhibits spasm. She exhibits normal range of motion, no tenderness, no bony tenderness, no swelling, no edema, no deformity, no laceration, no pain and normal pulse.       Lumbar back: She exhibits spasm. She exhibits normal range of motion, no tenderness, no bony tenderness, no swelling, no edema, no deformity, no laceration, no pain and normal pulse.  Lymphadenopathy:    She has no cervical adenopathy.  Neurological: She is alert and oriented to person, place, and time. She has normal reflexes. She displays normal reflexes. No cranial nerve deficit. She exhibits normal muscle tone. Coordination normal.  Skin: Skin is warm and dry. No rash noted. She is not diaphoretic. No erythema. No pallor.  Vitals reviewed.    Lab Results  Component Value Date   WBC 6.5 10/18/2011   HGB 14.0 10/18/2011   HCT 41.5 10/18/2011   PLT 224.0 10/18/2011   GLUCOSE 88 10/18/2011   CHOL 199 07/31/2009   TRIG 93.0 07/31/2009   HDL 59.20 07/31/2009   LDLCALC 121* 07/31/2009   ALT 12 10/18/2011   AST 14 10/18/2011   NA 141 10/18/2011   K 4.2 10/18/2011   CL 106 10/18/2011   CREATININE 0.8 10/18/2011   BUN 17 10/18/2011  CO2 28 10/18/2011   TSH 1.07 10/18/2011      Assessment & Plan:

## 2014-07-18 NOTE — Assessment & Plan Note (Signed)
Vaccines were reviewed and updated Exam done Labs ordered Pt ed material was given 

## 2014-07-18 NOTE — Patient Instructions (Signed)
Preventive Care for Adults A healthy lifestyle and preventive care can promote health and wellness. Preventive health guidelines for women include the following key practices.  A routine yearly physical is a good way to check with your health care provider about your health and preventive screening. It is a chance to share any concerns and updates on your health and to receive a thorough exam.  Visit your dentist for a routine exam and preventive care every 6 months. Brush your teeth twice a day and floss once a day. Good oral hygiene prevents tooth decay and gum disease.  The frequency of eye exams is based on your age, health, family medical history, use of contact lenses, and other factors. Follow your health care provider's recommendations for frequency of eye exams.  Eat a healthy diet. Foods like vegetables, fruits, whole grains, low-fat dairy products, and lean protein foods contain the nutrients you need without too many calories. Decrease your intake of foods high in solid fats, added sugars, and salt. Eat the right amount of calories for you.Get information about a proper diet from your health care provider, if necessary.  Regular physical exercise is one of the most important things you can do for your health. Most adults should get at least 150 minutes of moderate-intensity exercise (any activity that increases your heart rate and causes you to sweat) each week. In addition, most adults need muscle-strengthening exercises on 2 or more days a week.  Maintain a healthy weight. The body mass index (BMI) is a screening tool to identify possible weight problems. It provides an estimate of body fat based on height and weight. Your health care provider can find your BMI and can help you achieve or maintain a healthy weight.For adults 20 years and older:  A BMI below 18.5 is considered underweight.  A BMI of 18.5 to 24.9 is normal.  A BMI of 25 to 29.9 is considered overweight.  A BMI of  30 and above is considered obese.  Maintain normal blood lipids and cholesterol levels by exercising and minimizing your intake of saturated fat. Eat a balanced diet with plenty of fruit and vegetables. Blood tests for lipids and cholesterol should begin at age 76 and be repeated every 5 years. If your lipid or cholesterol levels are high, you are over 50, or you are at high risk for heart disease, you may need your cholesterol levels checked more frequently.Ongoing high lipid and cholesterol levels should be treated with medicines if diet and exercise are not working.  If you smoke, find out from your health care provider how to quit. If you do not use tobacco, do not start.  Lung cancer screening is recommended for adults aged 22-80 years who are at high risk for developing lung cancer because of a history of smoking. A yearly low-dose CT scan of the lungs is recommended for people who have at least a 30-pack-year history of smoking and are a current smoker or have quit within the past 15 years. A pack year of smoking is smoking an average of 1 pack of cigarettes a day for 1 year (for example: 1 pack a day for 30 years or 2 packs a day for 15 years). Yearly screening should continue until the smoker has stopped smoking for at least 15 years. Yearly screening should be stopped for people who develop a health problem that would prevent them from having lung cancer treatment.  If you are pregnant, do not drink alcohol. If you are breastfeeding,  be very cautious about drinking alcohol. If you are not pregnant and choose to drink alcohol, do not have more than 1 drink per day. One drink is considered to be 12 ounces (355 mL) of beer, 5 ounces (148 mL) of wine, or 1.5 ounces (44 mL) of liquor.  Avoid use of street drugs. Do not share needles with anyone. Ask for help if you need support or instructions about stopping the use of drugs.  High blood pressure causes heart disease and increases the risk of  stroke. Your blood pressure should be checked at least every 1 to 2 years. Ongoing high blood pressure should be treated with medicines if weight loss and exercise do not work.  If you are 75-52 years old, ask your health care provider if you should take aspirin to prevent strokes.  Diabetes screening involves taking a blood sample to check your fasting blood sugar level. This should be done once every 3 years, after age 15, if you are within normal weight and without risk factors for diabetes. Testing should be considered at a younger age or be carried out more frequently if you are overweight and have at least 1 risk factor for diabetes.  Breast cancer screening is essential preventive care for women. You should practice "breast self-awareness." This means understanding the normal appearance and feel of your breasts and may include breast self-examination. Any changes detected, no matter how small, should be reported to a health care provider. Women in their 58s and 30s should have a clinical breast exam (CBE) by a health care provider as part of a regular health exam every 1 to 3 years. After age 16, women should have a CBE every year. Starting at age 53, women should consider having a mammogram (breast X-ray test) every year. Women who have a family history of breast cancer should talk to their health care provider about genetic screening. Women at a high risk of breast cancer should talk to their health care providers about having an MRI and a mammogram every year.  Breast cancer gene (BRCA)-related cancer risk assessment is recommended for women who have family members with BRCA-related cancers. BRCA-related cancers include breast, ovarian, tubal, and peritoneal cancers. Having family members with these cancers may be associated with an increased risk for harmful changes (mutations) in the breast cancer genes BRCA1 and BRCA2. Results of the assessment will determine the need for genetic counseling and  BRCA1 and BRCA2 testing.  Routine pelvic exams to screen for cancer are no longer recommended for nonpregnant women who are considered low risk for cancer of the pelvic organs (ovaries, uterus, and vagina) and who do not have symptoms. Ask your health care provider if a screening pelvic exam is right for you.  If you have had past treatment for cervical cancer or a condition that could lead to cancer, you need Pap tests and screening for cancer for at least 20 years after your treatment. If Pap tests have been discontinued, your risk factors (such as having a new sexual partner) need to be reassessed to determine if screening should be resumed. Some women have medical problems that increase the chance of getting cervical cancer. In these cases, your health care provider may recommend more frequent screening and Pap tests.  The HPV test is an additional test that may be used for cervical cancer screening. The HPV test looks for the virus that can cause the cell changes on the cervix. The cells collected during the Pap test can be  tested for HPV. The HPV test could be used to screen women aged 30 years and older, and should be used in women of any age who have unclear Pap test results. After the age of 30, women should have HPV testing at the same frequency as a Pap test.  Colorectal cancer can be detected and often prevented. Most routine colorectal cancer screening begins at the age of 50 years and continues through age 75 years. However, your health care provider may recommend screening at an earlier age if you have risk factors for colon cancer. On a yearly basis, your health care provider may provide home test kits to check for hidden blood in the stool. Use of a small camera at the end of a tube, to directly examine the colon (sigmoidoscopy or colonoscopy), can detect the earliest forms of colorectal cancer. Talk to your health care provider about this at age 50, when routine screening begins. Direct  exam of the colon should be repeated every 5-10 years through age 75 years, unless early forms of pre-cancerous polyps or small growths are found.  People who are at an increased risk for hepatitis B should be screened for this virus. You are considered at high risk for hepatitis B if:  You were born in a country where hepatitis B occurs often. Talk with your health care provider about which countries are considered high risk.  Your parents were born in a high-risk country and you have not received a shot to protect against hepatitis B (hepatitis B vaccine).  You have HIV or AIDS.  You use needles to inject street drugs.  You live with, or have sex with, someone who has hepatitis B.  You get hemodialysis treatment.  You take certain medicines for conditions like cancer, organ transplantation, and autoimmune conditions.  Hepatitis C blood testing is recommended for all people born from 1945 through 1965 and any individual with known risks for hepatitis C.  Practice safe sex. Use condoms and avoid high-risk sexual practices to reduce the spread of sexually transmitted infections (STIs). STIs include gonorrhea, chlamydia, syphilis, trichomonas, herpes, HPV, and human immunodeficiency virus (HIV). Herpes, HIV, and HPV are viral illnesses that have no cure. They can result in disability, cancer, and death.  You should be screened for sexually transmitted illnesses (STIs) including gonorrhea and chlamydia if:  You are sexually active and are younger than 24 years.  You are older than 24 years and your health care provider tells you that you are at risk for this type of infection.  Your sexual activity has changed since you were last screened and you are at an increased risk for chlamydia or gonorrhea. Ask your health care provider if you are at risk.  If you are at risk of being infected with HIV, it is recommended that you take a prescription medicine daily to prevent HIV infection. This is  called preexposure prophylaxis (PrEP). You are considered at risk if:  You are a heterosexual woman, are sexually active, and are at increased risk for HIV infection.  You take drugs by injection.  You are sexually active with a partner who has HIV.  Talk with your health care provider about whether you are at high risk of being infected with HIV. If you choose to begin PrEP, you should first be tested for HIV. You should then be tested every 3 months for as long as you are taking PrEP.  Osteoporosis is a disease in which the bones lose minerals and strength   with aging. This can result in serious bone fractures or breaks. The risk of osteoporosis can be identified using a bone density scan. Women ages 65 years and over and women at risk for fractures or osteoporosis should discuss screening with their health care providers. Ask your health care provider whether you should take a calcium supplement or vitamin D to reduce the rate of osteoporosis.  Menopause can be associated with physical symptoms and risks. Hormone replacement therapy is available to decrease symptoms and risks. You should talk to your health care provider about whether hormone replacement therapy is right for you.  Use sunscreen. Apply sunscreen liberally and repeatedly throughout the day. You should seek shade when your shadow is shorter than you. Protect yourself by wearing long sleeves, pants, a wide-brimmed hat, and sunglasses year round, whenever you are outdoors.  Once a month, do a whole body skin exam, using a mirror to look at the skin on your back. Tell your health care provider of new moles, moles that have irregular borders, moles that are larger than a pencil eraser, or moles that have changed in shape or color.  Stay current with required vaccines (immunizations).  Influenza vaccine. All adults should be immunized every year.  Tetanus, diphtheria, and acellular pertussis (Td, Tdap) vaccine. Pregnant women should  receive 1 dose of Tdap vaccine during each pregnancy. The dose should be obtained regardless of the length of time since the last dose. Immunization is preferred during the 27th-36th week of gestation. An adult who has not previously received Tdap or who does not know her vaccine status should receive 1 dose of Tdap. This initial dose should be followed by tetanus and diphtheria toxoids (Td) booster doses every 10 years. Adults with an unknown or incomplete history of completing a 3-dose immunization series with Td-containing vaccines should begin or complete a primary immunization series including a Tdap dose. Adults should receive a Td booster every 10 years.  Varicella vaccine. An adult without evidence of immunity to varicella should receive 2 doses or a second dose if she has previously received 1 dose. Pregnant females who do not have evidence of immunity should receive the first dose after pregnancy. This first dose should be obtained before leaving the health care facility. The second dose should be obtained 4-8 weeks after the first dose.  Human papillomavirus (HPV) vaccine. Females aged 13-26 years who have not received the vaccine previously should obtain the 3-dose series. The vaccine is not recommended for use in pregnant females. However, pregnancy testing is not needed before receiving a dose. If a female is found to be pregnant after receiving a dose, no treatment is needed. In that case, the remaining doses should be delayed until after the pregnancy. Immunization is recommended for any person with an immunocompromised condition through the age of 26 years if she did not get any or all doses earlier. During the 3-dose series, the second dose should be obtained 4-8 weeks after the first dose. The third dose should be obtained 24 weeks after the first dose and 16 weeks after the second dose.  Zoster vaccine. One dose is recommended for adults aged 60 years or older unless certain conditions are  present.  Measles, mumps, and rubella (MMR) vaccine. Adults born before 1957 generally are considered immune to measles and mumps. Adults born in 1957 or later should have 1 or more doses of MMR vaccine unless there is a contraindication to the vaccine or there is laboratory evidence of immunity to   each of the three diseases. A routine second dose of MMR vaccine should be obtained at least 28 days after the first dose for students attending postsecondary schools, health care workers, or international travelers. People who received inactivated measles vaccine or an unknown type of measles vaccine during 1963-1967 should receive 2 doses of MMR vaccine. People who received inactivated mumps vaccine or an unknown type of mumps vaccine before 1979 and are at high risk for mumps infection should consider immunization with 2 doses of MMR vaccine. For females of childbearing age, rubella immunity should be determined. If there is no evidence of immunity, females who are not pregnant should be vaccinated. If there is no evidence of immunity, females who are pregnant should delay immunization until after pregnancy. Unvaccinated health care workers born before 1957 who lack laboratory evidence of measles, mumps, or rubella immunity or laboratory confirmation of disease should consider measles and mumps immunization with 2 doses of MMR vaccine or rubella immunization with 1 dose of MMR vaccine.  Pneumococcal 13-valent conjugate (PCV13) vaccine. When indicated, a person who is uncertain of her immunization history and has no record of immunization should receive the PCV13 vaccine. An adult aged 19 years or older who has certain medical conditions and has not been previously immunized should receive 1 dose of PCV13 vaccine. This PCV13 should be followed with a dose of pneumococcal polysaccharide (PPSV23) vaccine. The PPSV23 vaccine dose should be obtained at least 8 weeks after the dose of PCV13 vaccine. An adult aged 19  years or older who has certain medical conditions and previously received 1 or more doses of PPSV23 vaccine should receive 1 dose of PCV13. The PCV13 vaccine dose should be obtained 1 or more years after the last PPSV23 vaccine dose.  Pneumococcal polysaccharide (PPSV23) vaccine. When PCV13 is also indicated, PCV13 should be obtained first. All adults aged 65 years and older should be immunized. An adult younger than age 65 years who has certain medical conditions should be immunized. Any person who resides in a nursing home or long-term care facility should be immunized. An adult smoker should be immunized. People with an immunocompromised condition and certain other conditions should receive both PCV13 and PPSV23 vaccines. People with human immunodeficiency virus (HIV) infection should be immunized as soon as possible after diagnosis. Immunization during chemotherapy or radiation therapy should be avoided. Routine use of PPSV23 vaccine is not recommended for American Indians, [place] Natives, or people younger than 65 years unless there are medical conditions that require PPSV23 vaccine. When indicated, people who have unknown immunization and have no record of immunization should receive PPSV23 vaccine. One-time revaccination 5 years after the first dose of PPSV23 is recommended for people aged 19-64 years who have chronic kidney failure, nephrotic syndrome, asplenia, or immunocompromised conditions. People who received 1-2 doses of PPSV23 before age 65 years should receive another dose of PPSV23 vaccine at age 65 years or later if at least 5 years have passed since the previous dose. Doses of PPSV23 are not needed for people immunized with PPSV23 at or after age 65 years.  Meningococcal vaccine. Adults with asplenia or persistent complement component deficiencies should receive 2 doses of quadrivalent meningococcal conjugate (MenACWY-D) vaccine. The doses should be obtained at least 2 months apart.  Microbiologists working with certain meningococcal bacteria, military recruits, people at risk during an outbreak, and people who travel to or live in countries with a high rate of meningitis should be immunized. A first-year college student up through age   21 years who is living in a residence hall should receive a dose if she did not receive a dose on or after her 16th birthday. Adults who have certain high-risk conditions should receive one or more doses of vaccine.  Hepatitis A vaccine. Adults who wish to be protected from this disease, have certain high-risk conditions, work with hepatitis A-infected animals, work in hepatitis A research labs, or travel to or work in countries with a high rate of hepatitis A should be immunized. Adults who were previously unvaccinated and who anticipate close contact with an international adoptee during the first 60 days after arrival in the [place] from a country with a high rate of hepatitis A should be immunized.  Hepatitis B vaccine. Adults who wish to be protected from this disease, have certain high-risk conditions, may be exposed to blood or other infectious body fluids, are household contacts or sex partners of hepatitis B positive people, are clients or workers in certain care facilities, or travel to or work in countries with a high rate of hepatitis B should be immunized.  Haemophilus influenzae type b (Hib) vaccine. A previously unvaccinated person with asplenia or sickle cell disease or having a scheduled splenectomy should receive 1 dose of Hib vaccine. Regardless of previous immunization, a recipient of a hematopoietic stem cell transplant should receive a 3-dose series 6-12 months after her successful transplant. Hib vaccine is not recommended for adults with HIV infection. Preventive Services / Frequency Ages 64 to 68 years  Blood pressure check.** / Every 1 to 2 years.  Lipid and cholesterol check.** / Every 5 years beginning at age  22.  Clinical breast exam.** / Every 3 years for women in their 88s and 53s.  BRCA-related cancer risk assessment.** / For women who have family members with a BRCA-related cancer (breast, ovarian, tubal, or peritoneal cancers).  Pap test.** / Every 2 years from ages 90 through 51. Every 3 years starting at age 21 through age 56 or 3 with a history of 3 consecutive normal Pap tests.  HPV screening.** / Every 3 years from ages 24 through ages 1 to 46 with a history of 3 consecutive normal Pap tests.  Hepatitis C blood test.** / For any individual with known risks for hepatitis C.  Skin self-exam. / Monthly.  Influenza vaccine. / Every year.  Tetanus, diphtheria, and acellular pertussis (Tdap, Td) vaccine.** / Consult your health care provider. Pregnant women should receive 1 dose of Tdap vaccine during each pregnancy. 1 dose of Td every 10 years.  Varicella vaccine.** / Consult your health care provider. Pregnant females who do not have evidence of immunity should receive the first dose after pregnancy.  HPV vaccine. / 3 doses over 6 months, if 72 and younger. The vaccine is not recommended for use in pregnant females. However, pregnancy testing is not needed before receiving a dose.  Measles, mumps, rubella (MMR) vaccine.** / You need at least 1 dose of MMR if you were born in 1957 or later. You may also need a 2nd dose. For females of childbearing age, rubella immunity should be determined. If there is no evidence of immunity, females who are not pregnant should be vaccinated. If there is no evidence of immunity, females who are pregnant should delay immunization until after pregnancy.  Pneumococcal 13-valent conjugate (PCV13) vaccine.** / Consult your health care provider.  Pneumococcal polysaccharide (PPSV23) vaccine.** / 1 to 2 doses if you smoke cigarettes or if you have certain conditions.  Meningococcal vaccine.** /  1 dose if you are age 19 to 21 years and a first-year college  student living in a residence hall, or have one of several medical conditions, you need to get vaccinated against meningococcal disease. You may also need additional booster doses.  Hepatitis A vaccine.** / Consult your health care provider.  Hepatitis B vaccine.** / Consult your health care provider.  Haemophilus influenzae type b (Hib) vaccine.** / Consult your health care provider. Ages 40 to 64 years  Blood pressure check.** / Every 1 to 2 years.  Lipid and cholesterol check.** / Every 5 years beginning at age 20 years.  Lung cancer screening. / Every year if you are aged 55-80 years and have a 30-pack-year history of smoking and currently smoke or have quit within the past 15 years. Yearly screening is stopped once you have quit smoking for at least 15 years or develop a health problem that would prevent you from having lung cancer treatment.  Clinical breast exam.** / Every year after age 40 years.  BRCA-related cancer risk assessment.** / For women who have family members with a BRCA-related cancer (breast, ovarian, tubal, or peritoneal cancers).  Mammogram.** / Every year beginning at age 40 years and continuing for as long as you are in good health. Consult with your health care provider.  Pap test.** / Every 3 years starting at age 30 years through age 65 or 70 years with a history of 3 consecutive normal Pap tests.  HPV screening.** / Every 3 years from ages 30 years through ages 65 to 70 years with a history of 3 consecutive normal Pap tests.  Fecal occult blood test (FOBT) of stool. / Every year beginning at age 50 years and continuing until age 75 years. You may not need to do this test if you get a colonoscopy every 10 years.  Flexible sigmoidoscopy or colonoscopy.** / Every 5 years for a flexible sigmoidoscopy or every 10 years for a colonoscopy beginning at age 50 years and continuing until age 75 years.  Hepatitis C blood test.** / For all people born from 1945 through  1965 and any individual with known risks for hepatitis C.  Skin self-exam. / Monthly.  Influenza vaccine. / Every year.  Tetanus, diphtheria, and acellular pertussis (Tdap/Td) vaccine.** / Consult your health care provider. Pregnant women should receive 1 dose of Tdap vaccine during each pregnancy. 1 dose of Td every 10 years.  Varicella vaccine.** / Consult your health care provider. Pregnant females who do not have evidence of immunity should receive the first dose after pregnancy.  Zoster vaccine.** / 1 dose for adults aged 60 years or older.  Measles, mumps, rubella (MMR) vaccine.** / You need at least 1 dose of MMR if you were born in 1957 or later. You may also need a 2nd dose. For females of childbearing age, rubella immunity should be determined. If there is no evidence of immunity, females who are not pregnant should be vaccinated. If there is no evidence of immunity, females who are pregnant should delay immunization until after pregnancy.  Pneumococcal 13-valent conjugate (PCV13) vaccine.** / Consult your health care provider.  Pneumococcal polysaccharide (PPSV23) vaccine.** / 1 to 2 doses if you smoke cigarettes or if you have certain conditions.  Meningococcal vaccine.** / Consult your health care provider.  Hepatitis A vaccine.** / Consult your health care provider.  Hepatitis B vaccine.** / Consult your health care provider.  Haemophilus influenzae type b (Hib) vaccine.** / Consult your health care provider. Ages 65   years and over  Blood pressure check.** / Every 1 to 2 years.  Lipid and cholesterol check.** / Every 5 years beginning at age 22 years.  Lung cancer screening. / Every year if you are aged 73-80 years and have a 30-pack-year history of smoking and currently smoke or have quit within the past 15 years. Yearly screening is stopped once you have quit smoking for at least 15 years or develop a health problem that would prevent you from having lung cancer  treatment.  Clinical breast exam.** / Every year after age 4 years.  BRCA-related cancer risk assessment.** / For women who have family members with a BRCA-related cancer (breast, ovarian, tubal, or peritoneal cancers).  Mammogram.** / Every year beginning at age 40 years and continuing for as long as you are in good health. Consult with your health care provider.  Pap test.** / Every 3 years starting at age 9 years through age 34 or 91 years with 3 consecutive normal Pap tests. Testing can be stopped between 65 and 70 years with 3 consecutive normal Pap tests and no abnormal Pap or HPV tests in the past 10 years.  HPV screening.** / Every 3 years from ages 57 years through ages 64 or 45 years with a history of 3 consecutive normal Pap tests. Testing can be stopped between 65 and 70 years with 3 consecutive normal Pap tests and no abnormal Pap or HPV tests in the past 10 years.  Fecal occult blood test (FOBT) of stool. / Every year beginning at age 15 years and continuing until age 17 years. You may not need to do this test if you get a colonoscopy every 10 years.  Flexible sigmoidoscopy or colonoscopy.** / Every 5 years for a flexible sigmoidoscopy or every 10 years for a colonoscopy beginning at age 86 years and continuing until age 71 years.  Hepatitis C blood test.** / For all people born from [birth year] through 1965 and any individual with known risks for hepatitis C.  Osteoporosis screening.** / A one-time screening for women ages 83 years and over and women at risk for fractures or osteoporosis.  Skin self-exam. / Monthly.  Influenza vaccine. / Every year.  Tetanus, diphtheria, and acellular pertussis (Tdap/Td) vaccine.** / 1 dose of Td every 10 years.  Varicella vaccine.** / Consult your health care provider.  Zoster vaccine.** / 1 dose for adults aged 61 years or older.  Pneumococcal 13-valent conjugate (PCV13) vaccine.** / Consult your health care provider.  Pneumococcal  polysaccharide (PPSV23) vaccine.** / 1 dose for all adults aged 28 years and older.  Meningococcal vaccine.** / Consult your health care provider.  Hepatitis A vaccine.** / Consult your health care provider.  Hepatitis B vaccine.** / Consult your health care provider.  Haemophilus influenzae type b (Hib) vaccine.** / Consult your health care provider. ** Family history and personal history of risk and conditions may change your health care provider's recommendations. Document Released: 08/20/2001 Document Revised: 11/08/2013 Document Reviewed: 11/19/2010 [facility] Patient Information 2015 [place], [place]. This information is not intended to replace advice given to you by your health care provider. Make sure you discuss any questions you have with your health care provider.

## 2014-07-18 NOTE — Assessment & Plan Note (Signed)
Will try low dose flexeril and PT for this

## 2014-07-18 NOTE — Progress Notes (Signed)
Pre visit review using our clinic review tool, if applicable. No additional management support is needed unless otherwise documented below in the visit note. 

## 2014-07-26 ENCOUNTER — Ambulatory Visit: Payer: 59 | Attending: Family Medicine | Admitting: Physical Therapy

## 2014-07-26 DIAGNOSIS — M542 Cervicalgia: Secondary | ICD-10-CM | POA: Insufficient documentation

## 2014-08-02 ENCOUNTER — Ambulatory Visit: Payer: 59 | Admitting: Physical Therapy

## 2014-08-02 DIAGNOSIS — M542 Cervicalgia: Secondary | ICD-10-CM | POA: Diagnosis present

## 2014-08-08 ENCOUNTER — Ambulatory Visit: Payer: 59 | Attending: Family Medicine | Admitting: Physical Therapy

## 2014-08-08 DIAGNOSIS — M542 Cervicalgia: Secondary | ICD-10-CM | POA: Insufficient documentation

## 2014-08-15 ENCOUNTER — Ambulatory Visit: Payer: 59 | Admitting: Physical Therapy

## 2014-08-15 DIAGNOSIS — M542 Cervicalgia: Secondary | ICD-10-CM | POA: Diagnosis not present

## 2014-08-22 ENCOUNTER — Ambulatory Visit: Payer: 59 | Admitting: Physical Therapy

## 2014-08-22 DIAGNOSIS — M542 Cervicalgia: Secondary | ICD-10-CM | POA: Diagnosis not present

## 2014-09-29 ENCOUNTER — Other Ambulatory Visit: Payer: Self-pay | Admitting: Gynecology

## 2015-01-12 LAB — HM PAP SMEAR

## 2015-02-01 ENCOUNTER — Other Ambulatory Visit: Payer: Self-pay | Admitting: Gynecology

## 2015-02-02 LAB — CYTOLOGY - PAP

## 2015-03-21 LAB — HM MAMMOGRAPHY: HM MAMMO: NORMAL (ref 0–4)

## 2015-03-24 ENCOUNTER — Encounter: Payer: Self-pay | Admitting: Family

## 2015-03-24 ENCOUNTER — Ambulatory Visit (INDEPENDENT_AMBULATORY_CARE_PROVIDER_SITE_OTHER): Payer: 59 | Admitting: Family

## 2015-03-24 VITALS — BP 120/76 | HR 83 | Temp 97.6°F | Resp 18 | Ht 64.0 in | Wt 123.0 lb

## 2015-03-24 DIAGNOSIS — S39012A Strain of muscle, fascia and tendon of lower back, initial encounter: Secondary | ICD-10-CM | POA: Insufficient documentation

## 2015-03-24 MED ORDER — CYCLOBENZAPRINE HCL 10 MG PO TABS: 5.0000 mg | ORAL_TABLET | Freq: Three times a day (TID) | ORAL | Status: DC | PRN

## 2015-03-24 NOTE — Patient Instructions (Addendum)
Thank you for choosing [facility].  Summary/Instructions:  Your prescription(s) have been submitted to your pharmacy or been printed and provided for you. Please take as directed and contact our office if you believe you are having problem(s) with the medication(s) or have any questions.  If your symptoms worsen or fail to improve, please contact our office for further instruction, or in case of emergency go directly to the emergency room at the closest medical facility.   Low Back Strain with Rehab A strain is an injury in which a tendon or muscle is torn. The muscles and tendons of the lower back are vulnerable to strains. However, these muscles and tendons are very strong and require a great force to be injured. Strains are classified into three categories. Grade 1 strains cause pain, but the tendon is not lengthened. Grade 2 strains include a lengthened ligament, due to the ligament being stretched or partially ruptured. With grade 2 strains there is still function, although the function may be decreased. Grade 3 strains involve a complete tear of the tendon or muscle, and function is usually impaired. SYMPTOMS   Pain in the lower back.  Pain that affects one side more than the other.  Pain that gets worse with movement and may be felt in the hip, buttocks, or back of the thigh.  Muscle spasms of the muscles in the back.  Swelling along the muscles of the back.  Loss of strength of the back muscles.  Crackling sound (crepitation) when the muscles are touched. CAUSES  Lower back strains occur when a force is placed on the muscles or tendons that is greater than they can handle. Common causes of injury include:  Prolonged overuse of the muscle-tendon units in the lower back, usually from incorrect posture.  A single violent injury or force applied to the back. RISK INCREASES WITH:  Sports that involve twisting forces on the spine or a lot of bending at the waist (football,  rugby, weightlifting, bowling, golf, tennis, speed skating, racquetball, swimming, running, gymnastics, diving).  Poor strength and flexibility.  Failure to warm up properly before activity.  Family history of lower back pain or disk disorders.  Previous back injury or surgery (especially fusion).  Poor posture with lifting, especially heavy objects.  Prolonged sitting, especially with poor posture. PREVENTION   Learn and use proper posture when sitting or lifting (maintain proper posture when sitting, lift using the knees and legs, not at the waist).  Warm up and stretch properly before activity.  Allow for adequate recovery between workouts.  Maintain physical fitness:  Strength, flexibility, and endurance.  Cardiovascular fitness. PROGNOSIS  If treated properly, lower back strains usually heal within 6 weeks. RELATED COMPLICATIONS   Recurring symptoms, resulting in a chronic problem.  Chronic inflammation, scarring, and partial muscle-tendon tear.  Delayed healing or resolution of symptoms.  Prolonged disability. TREATMENT  Treatment first involves the use of ice and medicine, to reduce pain and inflammation. The use of strengthening and stretching exercises may help reduce pain with activity. These exercises may be performed at home or with a therapist. Severe injuries may require referral to a therapist for further evaluation and treatment, such as ultrasound. Your caregiver may advise that you wear a back brace or corset, to help reduce pain and discomfort. Often, prolonged bed rest results in greater harm then benefit. Corticosteroid injections may be recommended. However, these should be reserved for the most serious cases. It is important to avoid using your back when  lifting objects. At night, sleep on your back on a firm mattress with a pillow placed under your knees. If non-surgical treatment is unsuccessful, surgery may be needed.  MEDICATION   If pain medicine  is needed, nonsteroidal anti-inflammatory medicines (aspirin and ibuprofen), or other minor pain relievers (acetaminophen), are often advised.  Do not take pain medicine for 7 days before surgery.  Prescription pain relievers may be given, if your caregiver thinks they are needed. Use only as directed and only as much as you need.  Ointments applied to the skin may be helpful.  Corticosteroid injections may be given by your caregiver. These injections should be reserved for the most serious cases, because they may only be given a certain number of times. HEAT AND COLD  Cold treatment (icing) should be applied for 10 to 15 minutes every 2 to 3 hours for inflammation and pain, and immediately after activity that aggravates your symptoms. Use ice packs or an ice massage.  Heat treatment may be used before performing stretching and strengthening activities prescribed by your caregiver, physical therapist, or athletic trainer. Use a heat pack or a warm water soak. SEEK MEDICAL CARE IF:   Symptoms get worse or do not improve in 2 to 4 weeks, despite treatment.  You develop numbness, weakness, or loss of bowel or bladder function.  New, unexplained symptoms develop. (Drugs used in treatment may produce side effects.) EXERCISES  RANGE OF MOTION (ROM) AND STRETCHING EXERCISES - Low Back Strain Most people with lower back pain will find that their symptoms get worse with excessive bending forward (flexion) or arching at the lower back (extension). The exercises which will help resolve your symptoms will focus on the opposite motion.  Your physician, physical therapist or athletic trainer will help you determine which exercises will be most helpful to resolve your lower back pain. Do not complete any exercises without first consulting with your caregiver. Discontinue any exercises which make your symptoms worse until you speak to your caregiver.  If you have pain, numbness or tingling which travels  down into your buttocks, leg or foot, the goal of the therapy is for these symptoms to move closer to your back and eventually resolve. Sometimes, these leg symptoms will get better, but your lower back pain may worsen. This is typically an indication of progress in your rehabilitation. Be very alert to any changes in your symptoms and the activities in which you participated in the 24 hours prior to the change. Sharing this information with your caregiver will allow him/her to most efficiently treat your condition.  These exercises may help you when beginning to rehabilitate your injury. Your symptoms may resolve with or without further involvement from your physician, physical therapist or athletic trainer. While completing these exercises, remember:  Restoring tissue flexibility helps normal motion to return to the joints. This allows healthier, less painful movement and activity.  An effective stretch should be held for at least 30 seconds.  A stretch should never be painful. You should only feel a gentle lengthening or release in the stretched tissue. FLEXION RANGE OF MOTION AND STRETCHING EXERCISES: STRETCH - Flexion, Single Knee to Chest   Lie on a firm bed or floor with both legs extended in front of you.  Keeping one leg in contact with the floor, bring your opposite knee to your chest. Hold your leg in place by either grabbing behind your thigh or at your knee.  Pull until you feel a gentle stretch in your  lower back. Hold __________ seconds.  Slowly release your grasp and repeat the exercise with the opposite side. Repeat __________ times. Complete this exercise __________ times per day.  STRETCH - Flexion, Double Knee to Chest   Lie on a firm bed or floor with both legs extended in front of you.  Keeping one leg in contact with the floor, bring your opposite knee to your chest.  Tense your stomach muscles to support your back and then lift your other knee to your chest. Hold  your legs in place by either grabbing behind your thighs or at your knees.  Pull both knees toward your chest until you feel a gentle stretch in your lower back. Hold __________ seconds.  Tense your stomach muscles and slowly return one leg at a time to the floor. Repeat __________ times. Complete this exercise __________ times per day.  STRETCH - Low Trunk Rotation  Lie on a firm bed or floor. Keeping your legs in front of you, bend your knees so they are both pointed toward the ceiling and your feet are flat on the floor.  Extend your arms out to the side. This will stabilize your upper body by keeping your shoulders in contact with the floor.  Gently and slowly drop both knees together to one side until you feel a gentle stretch in your lower back. Hold for __________ seconds.  Tense your stomach muscles to support your lower back as you bring your knees back to the starting position. Repeat the exercise to the other side. Repeat __________ times. Complete this exercise __________ times per day  EXTENSION RANGE OF MOTION AND FLEXIBILITY EXERCISES: STRETCH - Extension, Prone on Elbows   Lie on your stomach on the floor, a bed will be too soft. Place your palms about shoulder width apart and at the height of your head.  Place your elbows under your shoulders. If this is too painful, stack pillows under your chest.  Allow your body to relax so that your hips drop lower and make contact more completely with the floor.  Hold this position for __________ seconds.  Slowly return to lying flat on the floor. Repeat __________ times. Complete this exercise __________ times per day.  RANGE OF MOTION - Extension, Prone Press Ups  Lie on your stomach on the floor, a bed will be too soft. Place your palms about shoulder width apart and at the height of your head.  Keeping your back as relaxed as possible, slowly straighten your elbows while keeping your hips on the floor. You may adjust the  placement of your hands to maximize your comfort. As you gain motion, your hands will come more underneath your shoulders.  Hold this position __________ seconds.  Slowly return to lying flat on the floor. Repeat __________ times. Complete this exercise __________ times per day.  RANGE OF MOTION- Quadruped, Neutral Spine   Assume a hands and knees position on a firm surface. Keep your hands under your shoulders and your knees under your hips. You may place padding under your knees for comfort.  Drop your head and point your tail bone toward the ground below you. This will round out your lower back like an angry cat. Hold this position for __________ seconds.  Slowly lift your head and release your tail bone so that your back sags into a large arch, like an old horse.  Hold this position for __________ seconds.  Repeat this until you feel limber in your lower back.  Now,  find your "sweet spot." This will be the most comfortable position somewhere between the two previous positions. This is your neutral spine. Once you have found this position, tense your stomach muscles to support your lower back.  Hold this position for __________ seconds. Repeat __________ times. Complete this exercise __________ times per day.  STRENGTHENING EXERCISES - Low Back Strain These exercises may help you when beginning to rehabilitate your injury. These exercises should be done near your "sweet spot." This is the neutral, low-back arch, somewhere between fully rounded and fully arched, that is your least painful position. When performed in this safe range of motion, these exercises can be used for people who have either a flexion or extension based injury. These exercises may resolve your symptoms with or without further involvement from your physician, physical therapist or athletic trainer. While completing these exercises, remember:   Muscles can gain both the endurance and the strength needed for everyday  activities through controlled exercises.  Complete these exercises as instructed by your physician, physical therapist or athletic trainer. Increase the resistance and repetitions only as guided.  You may experience muscle soreness or fatigue, but the pain or discomfort you are trying to eliminate should never worsen during these exercises. If this pain does worsen, stop and make certain you are following the directions exactly. If the pain is still present after adjustments, discontinue the exercise until you can discuss the trouble with your caregiver. STRENGTHENING - Deep Abdominals, Pelvic Tilt  Lie on a firm bed or floor. Keeping your legs in front of you, bend your knees so they are both pointed toward the ceiling and your feet are flat on the floor.  Tense your lower abdominal muscles to press your lower back into the floor. This motion will rotate your pelvis so that your tail bone is scooping upwards rather than pointing at your feet or into the floor.  With a gentle tension and even breathing, hold this position for __________ seconds. Repeat __________ times. Complete this exercise __________ times per day.  STRENGTHENING - Abdominals, Crunches   Lie on a firm bed or floor. Keeping your legs in front of you, bend your knees so they are both pointed toward the ceiling and your feet are flat on the floor. Cross your arms over your chest.  Slightly tip your chin down without bending your neck.  Tense your abdominals and slowly lift your trunk high enough to just clear your shoulder blades. Lifting higher can put excessive stress on the lower back and does not further strengthen your abdominal muscles.  Control your return to the starting position. Repeat __________ times. Complete this exercise __________ times per day.  STRENGTHENING - Quadruped, Opposite UE/LE Lift   Assume a hands and knees position on a firm surface. Keep your hands under your shoulders and your knees under your  hips. You may place padding under your knees for comfort.  Find your neutral spine and gently tense your abdominal muscles so that you can maintain this position. Your shoulders and hips should form a rectangle that is parallel with the floor and is not twisted.  Keeping your trunk steady, lift your right hand no higher than your shoulder and then your left leg no higher than your hip. Make sure you are not holding your breath. Hold this position __________ seconds.  Continuing to keep your abdominal muscles tense and your back steady, slowly return to your starting position. Repeat with the opposite arm and leg. Repeat __________   times. Complete this exercise __________ times per day.  STRENGTHENING - Lower Abdominals, Double Knee Lift  Lie on a firm bed or floor. Keeping your legs in front of you, bend your knees so they are both pointed toward the ceiling and your feet are flat on the floor.  Tense your abdominal muscles to brace your lower back and slowly lift both of your knees until they come over your hips. Be certain not to hold your breath.  Hold __________ seconds. Using your abdominal muscles, return to the starting position in a slow and controlled manner. Repeat __________ times. Complete this exercise __________ times per day.  POSTURE AND BODY MECHANICS CONSIDERATIONS - Low Back Strain Keeping correct posture when sitting, standing or completing your activities will reduce the stress put on different body tissues, allowing injured tissues a chance to heal and limiting painful experiences. The following are general guidelines for improved posture. Your physician or physical therapist will provide you with any instructions specific to your needs. While reading these guidelines, remember:  The exercises prescribed by your provider will help you have the flexibility and strength to maintain correct postures.  The correct posture provides the best environment for your joints to work.  All of your joints have less wear and tear when properly supported by a spine with good posture. This means you will experience a healthier, less painful body.  Correct posture must be practiced with all of your activities, especially prolonged sitting and standing. Correct posture is as important when doing repetitive low-stress activities (typing) as it is when doing a single heavy-load activity (lifting). RESTING POSITIONS Consider which positions are most painful for you when choosing a resting position. If you have pain with flexion-based activities (sitting, bending, stooping, squatting), choose a position that allows you to rest in a less flexed posture. You would want to avoid curling into a fetal position on your side. If your pain worsens with extension-based activities (prolonged standing, working overhead), avoid resting in an extended position such as sleeping on your stomach. Most people will find more comfort when they rest with their spine in a more neutral position, neither too rounded nor too arched. Lying on a non-sagging bed on your side with a pillow between your knees, or on your back with a pillow under your knees will often provide some relief. Keep in mind, being in any one position for a prolonged period of time, no matter how correct your posture, can still lead to stiffness. PROPER SITTING POSTURE In order to minimize stress and discomfort on your spine, you must sit with correct posture. Sitting with good posture should be effortless for a healthy body. Returning to good posture is a gradual process. Many people can work toward this most comfortably by using various supports until they have the flexibility and strength to maintain this posture on their own. When sitting with proper posture, your ears will fall over your shoulders and your shoulders will fall over your hips. You should use the back of the chair to support your upper back. Your lower back will be in a neutral  position, just slightly arched. You may place a small pillow or folded towel at the base of your lower back for support.  When working at a desk, create an environment that supports good, upright posture. Without extra support, muscles tire, which leads to excessive strain on joints and other tissues. Keep these recommendations in mind: CHAIR:  A chair should be able to slide under your   desk when your back makes contact with the back of the chair. This allows you to work closely.  The chair's height should allow your eyes to be level with the upper part of your monitor and your hands to be slightly lower than your elbows. BODY POSITION  Your feet should make contact with the floor. If this is not possible, use a foot rest.  Keep your ears over your shoulders. This will reduce stress on your neck and lower back. INCORRECT SITTING POSTURES  If you are feeling tired and unable to assume a healthy sitting posture, do not slouch or slump. This puts excessive strain on your back tissues, causing more damage and pain. Healthier options include:  Using more support, like a lumbar pillow.  Switching tasks to something that requires you to be upright or walking.  Talking a brief walk.  Lying down to rest in a neutral-spine position. PROLONGED STANDING WHILE SLIGHTLY LEANING FORWARD  When completing a task that requires you to lean forward while standing in one place for a long time, place either foot up on a stationary 2-4 inch high object to help maintain the best posture. When both feet are on the ground, the lower back tends to lose its slight inward curve. If this curve flattens (or becomes too large), then the back and your other joints will experience too much stress, tire more quickly, and can cause pain. CORRECT STANDING POSTURES Proper standing posture should be assumed with all daily activities, even if they only take a few moments, like when brushing your teeth. As in sitting, your ears  should fall over your shoulders and your shoulders should fall over your hips. You should keep a slight tension in your abdominal muscles to brace your spine. Your tailbone should point down to the ground, not behind your body, resulting in an over-extended swayback posture.  INCORRECT STANDING POSTURES  Common incorrect standing postures include a forward head, locked knees and/or an excessive swayback. WALKING Walk with an upright posture. Your ears, shoulders and hips should all line-up. PROLONGED ACTIVITY IN A FLEXED POSITION When completing a task that requires you to bend forward at your waist or lean over a low surface, try to find a way to stabilize 3 out of 4 of your limbs. You can place a hand or elbow on your thigh or rest a knee on the surface you are reaching across. This will provide you more stability so that your muscles do not fatigue as quickly. By keeping your knees relaxed, or slightly bent, you will also reduce stress across your lower back. CORRECT LIFTING TECHNIQUES DO :   Assume a wide stance. This will provide you more stability and the opportunity to get as close as possible to the object which you are lifting.  Tense your abdominals to brace your spine. Bend at the knees and hips. Keeping your back locked in a neutral-spine position, lift using your leg muscles. Lift with your legs, keeping your back straight.  Test the weight of unknown objects before attempting to lift them.  Try to keep your elbows locked down at your sides in order get the best strength from your shoulders when carrying an object.  Always ask for help when lifting heavy or awkward objects. INCORRECT LIFTING TECHNIQUES DO NOT:   Lock your knees when lifting, even if it is a small object.  Bend and twist. Pivot at your feet or move your feet when needing to change directions.  Assume that you   can safely pick up even a paper clip without proper posture. Document Released: 06/24/2005 Document  Revised: 09/16/2011 Document Reviewed: 10/06/2008 [facility] Patient Information 2015 [place], [place]. This information is not intended to replace advice given to you by your health care provider. Make sure you discuss any questions you have with your health care provider.

## 2015-03-24 NOTE — Assessment & Plan Note (Signed)
Symptoms and exam consistent with low back strain and muscle spasm. Treat conservatively with moist heat and over-the-counter anti-inflammatories as needed for discomfort. Initiate home exercise therapy program. Start cyclobenzaprine as needed for muscle spasm. Follow-up if symptoms worsen or fail to improve with home therapy and medication.

## 2015-03-24 NOTE — Progress Notes (Signed)
Subjective:    Patient ID: Janice Spencer, female    DOB: [birth year], 49 y.o.   MRN: [id number]  Chief Complaint  Patient presents with  . Back Pain    x2 weeks was moving and hurt her back, last night she sneezed during the night and now she can barely move, none of the medication she is prescribed helped    HPI:  Janice Spencer is a 49 y.o. female with a PMH of Crohn's disease, fibromyalgia, anxiety and depression who presents today for an acute office visit.   Associated symptom of pain located in her lower back started a couple of weeks ago following moving boxes. Modifying factors include ibuprofen and rest which seemed to help some. Pain is described as tight and sharp with certain movements that is relieved by sitting still. Severity of the pain is rated 5/10. Denies radiculopathy, changes to bowel or bladder habits or saddle anesthesia. Aggravating factor was last night when she sneezed causing her back to spasm.   Allergies  Allergen Reactions  . Cymbalta [Duloxetine Hcl]     syncope  . Flagyl [Metronidazole]     rash  . Codeine   . Morphine   . Penicillins     Current Outpatient Prescriptions on File Prior to Visit  Medication Sig Dispense Refill  . clonazePAM (KLONOPIN) 1 MG tablet Take 1 mg by mouth at bedtime as needed.      . pregabalin (LYRICA) 75 MG capsule Take 1 capsule (75 mg total) by mouth 2 (two) times daily. 180 capsule 1  . traMADol (ULTRAM) 50 MG tablet Take 1 tablet (50 mg total) by mouth every 6 (six) hours as needed for moderate pain. 50 tablet 5   No current facility-administered medications on file prior to visit.    Review of Systems  Constitutional: Negative for fever and chills.  Musculoskeletal: Positive for back pain.  Neurological: Negative for weakness and numbness.      Objective:    BP 120/76 mmHg  Pulse 83  Temp(Src) 97.6 F (36.4 C) (Oral)  Resp 18  Ht 5\' 4"  (1.626 m)  Wt 123 lb (55.792 kg)  BMI 21.10 kg/m2  SpO2  98% Nursing note and vital signs reviewed.  Physical Exam  Constitutional: She is oriented to person, place, and time. She appears well-developed and well-nourished. No distress.  Cardiovascular: Normal rate, regular rhythm, normal heart sounds and intact distal pulses.   Pulmonary/Chest: Effort normal and breath sounds normal.  Musculoskeletal:  Lumbar spine - no obvious deformity, discoloration, or edema noted. Tenderness along the lumbar paraspinal musculature bilaterally with left greater than the right. Significantly decreased flexion with close to normal rotation and lateral bending. Distal pulses, sensation, and reflexes are intact and appropriate.  Neurological: She is alert and oriented to person, place, and time.  Skin: Skin is warm and dry.  Psychiatric: She has a normal mood and affect. Her behavior is normal. Judgment and thought content normal.       Assessment & Plan:   Problem List Items Addressed This Visit      Musculoskeletal and Integument   Low back strain - Primary    Symptoms and exam consistent with low back strain and muscle spasm. Treat conservatively with moist heat and over-the-counter anti-inflammatories as needed for discomfort. Initiate home exercise therapy program. Start cyclobenzaprine as needed for muscle spasm. Follow-up if symptoms worsen or fail to improve with home therapy and medication.      Relevant Medications  cyclobenzaprine (FLEXERIL) 10 MG tablet

## 2015-03-24 NOTE — Progress Notes (Signed)
Pre visit review using our clinic review tool, if applicable. No additional management support is needed unless otherwise documented below in the visit note. 

## 2015-06-20 ENCOUNTER — Other Ambulatory Visit: Payer: Self-pay | Admitting: Cardiovascular Disease

## 2015-06-20 DIAGNOSIS — J9801 Acute bronchospasm: Secondary | ICD-10-CM

## 2015-06-20 MED ORDER — ALBUTEROL SULFATE HFA 108 (90 BASE) MCG/ACT IN AERS: 2.0000 | INHALATION_SPRAY | Freq: Four times a day (QID) | RESPIRATORY_TRACT | Status: DC | PRN

## 2015-06-20 NOTE — Progress Notes (Signed)
Janice Spencer has been having cough and chest tightness. Will prescribe Albuterol HFA    Alyzza Andringa, Wonda Cheng, MD  06/20/2015 10:39 AM    [facility] [place],  [place], [place]  [address] Pager [phone] Phone: [phone]; Fax: [phone]   [facility]  [address] [place], [place]  [address] [phone]   Fax [phone]

## 2015-07-28 MED FILL — AMPHETAMINE SALTS 10 MG TAB: 10 | 30 days supply | Qty: 60 | Fill #0

## 2015-07-28 MED FILL — LYRICA 150 MG CAPSULE: 150 | 90 days supply | Qty: 90 | Fill #0

## 2015-10-03 MED FILL — AMPHETAMINE SALTS 10 MG TAB: 10 | 30 days supply | Qty: 60 | Fill #0

## 2015-10-03 MED FILL — clonazePAM 0.5 MG TABS: 0.5 | 30 days supply | Qty: 60 | Fill #0

## 2015-10-17 DIAGNOSIS — H5213 Myopia, bilateral: Secondary | ICD-10-CM | POA: Diagnosis not present

## 2015-10-26 MED FILL — LYRICA 75 MG CAPSULE: 75 | 30 days supply | Qty: 60 | Fill #0

## 2015-11-01 DIAGNOSIS — F330 Major depressive disorder, recurrent, mild: Secondary | ICD-10-CM | POA: Diagnosis not present

## 2015-11-02 MED FILL — CHLORHEXIDINE 0.12% RINSE: 0.12 | 17 days supply | Qty: 473 | Fill #0

## 2015-11-02 MED FILL — CLINDAMYCIN HCL 150 MG CAP: 150 | 7 days supply | Qty: 28 | Fill #0

## 2015-11-30 ENCOUNTER — Encounter: Payer: Self-pay | Admitting: Internal Medicine

## 2015-12-07 ENCOUNTER — Other Ambulatory Visit: Payer: Self-pay | Admitting: Internal Medicine

## 2015-12-07 DIAGNOSIS — M797 Fibromyalgia: Secondary | ICD-10-CM

## 2015-12-07 DIAGNOSIS — K5000 Crohn's disease of small intestine without complications: Secondary | ICD-10-CM

## 2015-12-07 DIAGNOSIS — Z0000 Encounter for general adult medical examination without abnormal findings: Secondary | ICD-10-CM

## 2015-12-07 MED FILL — DEXTROAMP-AMP 10 MG TAB: 10 | 30 days supply | Qty: 60 | Fill #0

## 2015-12-07 NOTE — Telephone Encounter (Signed)
Patient is calling to see if you had seen your mychart messages. And if she is going to be able to have this lab work done. Please advise or follow up.

## 2015-12-08 MED FILL — LYRICA 75 MG CAPSULE: 75 | 90 days supply | Qty: 360 | Fill #0

## 2015-12-13 ENCOUNTER — Other Ambulatory Visit (INDEPENDENT_AMBULATORY_CARE_PROVIDER_SITE_OTHER): Payer: 59 | Admitting: *Deleted

## 2015-12-13 DIAGNOSIS — M797 Fibromyalgia: Secondary | ICD-10-CM

## 2015-12-13 DIAGNOSIS — S39012A Strain of muscle, fascia and tendon of lower back, initial encounter: Secondary | ICD-10-CM | POA: Diagnosis not present

## 2015-12-13 DIAGNOSIS — Z0000 Encounter for general adult medical examination without abnormal findings: Secondary | ICD-10-CM | POA: Diagnosis not present

## 2015-12-13 LAB — CBC WITH DIFFERENTIAL/PLATELET
Basophils Absolute: 0 {cells}/uL (ref 0–200)
Basophils Relative: 0 %
Eosinophils Absolute: 171 {cells}/uL (ref 15–500)
Eosinophils Relative: 3 %
HCT: 40.7 % (ref 35.0–45.0)
Hemoglobin: 14.1 g/dL (ref 11.7–15.5)
Lymphocytes Relative: 40 %
Lymphs Abs: 2280 {cells}/uL (ref 850–3900)
MCH: 31 pg (ref 27.0–33.0)
MCHC: 34.6 g/dL (ref 32.0–36.0)
MCV: 89.5 fL (ref 80.0–100.0)
MPV: 10.5 fL (ref 7.5–12.5)
Monocytes Absolute: 513 {cells}/uL (ref 200–950)
Monocytes Relative: 9 %
Neutro Abs: 2736 {cells}/uL (ref 1500–7800)
Neutrophils Relative %: 48 %
Platelets: 229 K/uL (ref 140–400)
RBC: 4.55 MIL/uL (ref 3.80–5.10)
RDW: 13.5 % (ref 11.0–15.0)
WBC: 5.7 K/uL (ref 3.8–10.8)

## 2015-12-13 LAB — FERRITIN: Ferritin: 37 ng/mL (ref 10–232)

## 2015-12-13 LAB — T4, FREE: Free T4: 1.2 ng/dL (ref 0.8–1.8)

## 2015-12-13 LAB — HEMOGLOBIN A1C
Hgb A1c MFr Bld: 5.6 %
Mean Plasma Glucose: 114 mg/dL

## 2015-12-13 LAB — T3, FREE: T3, Free: 2.9 pg/mL (ref 2.3–4.2)

## 2015-12-13 LAB — TSH: TSH: 1.6 mIU/L

## 2015-12-13 NOTE — Addendum Note (Signed)
Addended by: Eulis Foster on: 12/13/2015 08:04 AM   Modules accepted: Orders

## 2015-12-14 ENCOUNTER — Encounter: Payer: Self-pay | Admitting: Internal Medicine

## 2015-12-14 LAB — COMPREHENSIVE METABOLIC PANEL
ALT: 11 U/L (ref 6–29)
AST: 14 U/L (ref 10–35)
Albumin: 4 g/dL (ref 3.6–5.1)
Alkaline Phosphatase: 40 U/L (ref 33–115)
BUN: 18 mg/dL (ref 7–25)
CALCIUM: 8.5 mg/dL — AB (ref 8.6–10.2)
CHLORIDE: 105 mmol/L (ref 98–110)
CO2: 22 mmol/L (ref 20–31)
Creat: 0.73 mg/dL (ref 0.50–1.10)
GLUCOSE: 87 mg/dL (ref 65–99)
POTASSIUM: 4.5 mmol/L (ref 3.5–5.3)
Sodium: 138 mmol/L (ref 135–146)
Total Bilirubin: 0.5 mg/dL (ref 0.2–1.2)
Total Protein: 6.1 g/dL (ref 6.1–8.1)

## 2015-12-14 LAB — LIPID PANEL
CHOL/HDL RATIO: 2.7 ratio (ref <=5.0)
Cholesterol: 193 mg/dL (ref 125–200)
HDL: 71 mg/dL (ref >=46)
LDL CALC: 104 mg/dL (ref <130)
Triglycerides: 92 mg/dL (ref <150)
VLDL: 18 mg/dL (ref <30)

## 2015-12-14 LAB — VITAMIN D 25 HYDROXY (VIT D DEFICIENCY, FRACTURES): Vit D, 25-Hydroxy: 37 ng/mL (ref 30–100)

## 2015-12-14 LAB — IBC PANEL
%SAT: 41 % (ref 11–50)
TIBC: 301 ug/dL (ref 250–450)
UIBC: 178 ug/dL (ref 125–400)

## 2015-12-14 LAB — MAGNESIUM: MAGNESIUM: 2.1 mg/dL (ref 1.5–2.5)

## 2015-12-14 LAB — IRON: IRON: 123 ug/dL (ref 40–190)

## 2015-12-20 ENCOUNTER — Encounter: Payer: Self-pay | Admitting: Internal Medicine

## 2015-12-20 ENCOUNTER — Ambulatory Visit (INDEPENDENT_AMBULATORY_CARE_PROVIDER_SITE_OTHER): Payer: 59 | Admitting: Internal Medicine

## 2015-12-20 ENCOUNTER — Other Ambulatory Visit (INDEPENDENT_AMBULATORY_CARE_PROVIDER_SITE_OTHER): Payer: 59

## 2015-12-20 DIAGNOSIS — Z0000 Encounter for general adult medical examination without abnormal findings: Secondary | ICD-10-CM | POA: Diagnosis not present

## 2015-12-20 DIAGNOSIS — G4701 Insomnia due to medical condition: Secondary | ICD-10-CM

## 2015-12-20 LAB — PHOSPHORUS: Phosphorus: 3.4 mg/dL (ref 2.3–4.6)

## 2015-12-20 MED ORDER — SUVOREXANT 20 MG PO TABS: 1.0000 | ORAL_TABLET | Freq: Every evening | ORAL | Status: DC | PRN

## 2015-12-20 NOTE — Progress Notes (Signed)
Pre visit review using our clinic review tool, if applicable. No additional management support is needed unless otherwise documented below in the visit note. 

## 2015-12-20 NOTE — Progress Notes (Signed)
Subjective:  Patient ID: Janice Spencer, female    DOB: 1966/02/27  Age: 50 y.o. MRN: [id number]  CC: Annual Exam and Insomnia   HPI Janice Spencer presents for a CPX.  A few weeks ago she sent me an email and requested multiple labs in relation to fibromyalgia. All of the labs were normal with the exception of a slightly low calcium level. She tells me over the last 2-3 months she has developed worsening insomnia, she describes the difficulty falling asleep, frequent awakenings, and early morning awakenings. She tells me this is made her fibromyalgia pain worse. She complains of feeling achy all over with fatigue and weight gain. She has been trying Benadryl for the insomnia.  Past Medical History  Diagnosis Date  . Endometriosis   . IBS (irritable bowel syndrome)   . Anxiety   . Depression    Past Surgical History  Procedure Laterality Date  . Vaginal hysterectomy    . Cholecystectomy    . Nasal bone spur removed      reports that she quit smoking about 7 years ago. She has never used smokeless tobacco. She reports that she drinks about 1.2 oz of alcohol per week. She reports that she does not use illicit drugs. family history includes Heart disease in her father; Hypertension in her mother. There is no history of Cancer, Alcohol abuse, or Stroke. Allergies  Allergen Reactions  . Cymbalta [Duloxetine Hcl]     syncope  . Flagyl [Metronidazole]     rash  . Codeine   . Morphine   . Penicillins     Outpatient Prescriptions Prior to Visit  Medication Sig Dispense Refill  . amphetamine-dextroamphetamine (ADDERALL) 10 MG tablet Take 10 mg by mouth daily with breakfast.    . clonazePAM (KLONOPIN) 1 MG tablet Take 1 mg by mouth at bedtime as needed.      . cyclobenzaprine (FLEXERIL) 10 MG tablet Take 0.5-1 tablets (5-10 mg total) by mouth 3 (three) times daily as needed for muscle spasms. 30 tablet 0  . pregabalin (LYRICA) 75 MG capsule Take 1 capsule (75 mg total) by mouth  2 (two) times daily. 180 capsule 1  . albuterol (PROVENTIL HFA;VENTOLIN HFA) 108 (90 BASE) MCG/ACT inhaler Inhale 2 puffs into the lungs every 6 (six) hours as needed for wheezing or shortness of breath. 1 Inhaler 2  . traMADol (ULTRAM) 50 MG tablet Take 1 tablet (50 mg total) by mouth every 6 (six) hours as needed for moderate pain. (Patient not taking: Reported on 12/20/2015) 50 tablet 5   No facility-administered medications prior to visit.    ROS Review of Systems  Constitutional: Positive for activity change, fatigue and unexpected weight change. Negative for appetite change.  HENT: Negative.  Negative for sinus pressure, sore throat, trouble swallowing and voice change.   Eyes: Negative.   Respiratory: Negative.  Negative for cough, choking, chest tightness, shortness of breath and stridor.   Cardiovascular: Negative.  Negative for chest pain, palpitations and leg swelling.  Gastrointestinal: Negative.  Negative for nausea, vomiting, abdominal pain, diarrhea, constipation and blood in stool.  Endocrine: Negative.   Genitourinary: Negative.   Musculoskeletal: Positive for myalgias. Negative for back pain, joint swelling, arthralgias, gait problem, neck pain and neck stiffness.  Skin: Negative.   Allergic/Immunologic: Negative.   Neurological: Negative.   Hematological: Negative.  Negative for adenopathy. Does not bruise/bleed easily.  Psychiatric/Behavioral: Positive for sleep disturbance. Negative for suicidal ideas, hallucinations, dysphoric mood, decreased concentration and agitation.  The patient is not nervous/anxious.     Objective:  BP 110/76 mmHg  Pulse 76  Temp(Src) 98.5 F (36.9 C) (Oral)  Resp 16  Ht 5\' 4"  (1.626 m)  Wt 134 lb (60.782 kg)  BMI 22.99 kg/m2  SpO2 97%  BP Readings from Last 3 Encounters:  12/20/15 110/76  03/24/15 120/76  07/18/14 106/64    Wt Readings from Last 3 Encounters:  12/20/15 134 lb (60.782 kg)  03/24/15 123 lb (55.792 kg)  07/18/14  126 lb (57.153 kg)    Physical Exam  Constitutional: She is oriented to person, place, and time.  Non-toxic appearance. She does not have a sickly appearance. She does not appear ill. No distress.  She is well kempt and well-groomed, she has a good tan and looks very well put together  HENT:  Head: Normocephalic and atraumatic.  Mouth/Throat: Oropharynx is clear and moist. No oropharyngeal exudate.  Eyes: Conjunctivae are normal. Right eye exhibits no discharge. Left eye exhibits no discharge. No scleral icterus.  Neck: Normal range of motion. Neck supple. No JVD present. No tracheal deviation present. No thyromegaly present.  Cardiovascular: Normal rate, regular rhythm, normal heart sounds and intact distal pulses.  Exam reveals no gallop and no friction rub.   No murmur heard. Pulmonary/Chest: Effort normal and breath sounds normal. No stridor. No respiratory distress. She has no wheezes. She has no rales. She exhibits no tenderness.  Abdominal: Soft. Bowel sounds are normal. She exhibits no distension and no mass. There is no tenderness. There is no rebound and no guarding.  Musculoskeletal: Normal range of motion. She exhibits no edema or tenderness.  Lymphadenopathy:    She has no cervical adenopathy.  Neurological: She is oriented to person, place, and time.  Skin: Skin is warm and dry. No rash noted. She is not diaphoretic. No erythema. No pallor.  Psychiatric: She has a normal mood and affect. Her behavior is normal. Judgment and thought content normal.  Vitals reviewed.   Lab Results  Component Value Date   WBC 5.7 12/13/2015   HGB 14.1 12/13/2015   HCT 40.7 12/13/2015   PLT 229 12/13/2015   GLUCOSE 87 12/13/2015   CHOL 193 12/13/2015   TRIG 92 12/13/2015   HDL 71 12/13/2015   LDLCALC 104 12/13/2015   ALT 11 12/13/2015   AST 14 12/13/2015   NA 138 12/13/2015   K 4.5 12/13/2015   CL 105 12/13/2015   CREATININE 0.73 12/13/2015   BUN 18 12/13/2015   CO2 22 12/13/2015     TSH 1.60 12/13/2015   HGBA1C 5.6 12/13/2015    No results found.  Assessment & Plan:   Janice Spencer was seen today for annual exam and insomnia.  Diagnoses and all orders for this visit:  Hypocalcemia- I will screen her for calcium disorders by rechecking her calcium level and phosphorus level, her recent magnesium level was normal, will get a parathyroid hormone to see she has hypoparathyroidism. -     PTH, intact and calcium; Future -     Phosphorus; Future  Insomnia due to medical condition- I think treating her insomnia may help with the fibromyalgia so I've asked her to start taking Belsomra -     Suvorexant (BELSOMRA) 20 MG TABS; Take 1 tablet by mouth at bedtime as needed.  Routine general medical examination at a health care facility- exam completed, labs a been ordered and reviewed, her mammogram and Pap smear up-to-date with her gynecologist, vaccines were reviewed, patient education material  was given.   I have discontinued Ms. Bouwens's traMADol and albuterol. I am also having her start on Suvorexant. Additionally, I am having her maintain her clonazePAM, pregabalin, amphetamine-dextroamphetamine, and cyclobenzaprine.  Meds ordered this encounter  Medications  . Suvorexant (BELSOMRA) 20 MG TABS    Sig: Take 1 tablet by mouth at bedtime as needed.    Dispense:  30 tablet    Refill:  5     Follow-up: Return if symptoms worsen or fail to improve.  Scarlette Calico, MD

## 2015-12-20 NOTE — Patient Instructions (Signed)
Insomnia Insomnia is a sleep disorder that makes it difficult to fall asleep or to stay asleep. Insomnia can cause tiredness (fatigue), low energy, difficulty concentrating, mood swings, and poor performance at work or school.  There are three different ways to classify insomnia:  Difficulty falling asleep.  Difficulty staying asleep.  Waking up too early in the morning. Any type of insomnia can be long-term (chronic) or short-term (acute). Both are common. Short-term insomnia usually lasts for three months or less. Chronic insomnia occurs at least three times a week for longer than three months. CAUSES  Insomnia may be caused by another condition, situation, or substance, such as:  Anxiety.  Certain medicines.  Gastroesophageal reflux disease (GERD) or other gastrointestinal conditions.  Asthma or other breathing conditions.  Restless legs syndrome, sleep apnea, or other sleep disorders.  Chronic pain.  Menopause. This may include hot flashes.  Stroke.  Abuse of alcohol, tobacco, or illegal drugs.  Depression.  Caffeine.   Neurological disorders, such as Alzheimer disease.  An overactive thyroid (hyperthyroidism). The cause of insomnia may not be known. RISK FACTORS Risk factors for insomnia include:  Gender. Women are more commonly affected than men.  Age. Insomnia is more common as you get older.  Stress. This may involve your professional or personal life.  Income. Insomnia is more common in people with lower income.  Lack of exercise.   Irregular work schedule or night shifts.  Traveling between different time zones. SIGNS AND SYMPTOMS If you have insomnia, trouble falling asleep or trouble staying asleep is the main symptom. This may lead to other symptoms, such as:  Feeling fatigued.  Feeling nervous about going to sleep.  Not feeling rested in the morning.  Having trouble concentrating.  Feeling irritable, anxious, or depressed. TREATMENT   Treatment for insomnia depends on the cause. If your insomnia is caused by an underlying condition, treatment will focus on addressing the condition. Treatment may also include:   Medicines to help you sleep.  Counseling or therapy.  Lifestyle adjustments. HOME CARE INSTRUCTIONS   Take medicines only as directed by your health care provider.  Keep regular sleeping and waking hours. Avoid naps.  Keep a sleep diary to help you and your health care provider figure out what could be causing your insomnia. Include:   When you sleep.  When you wake up during the night.  How well you sleep.   How rested you feel the next day.  Any side effects of medicines you are taking.  What you eat and drink.   Make your bedroom a comfortable place where it is easy to fall asleep:  Put up shades or special blackout curtains to block light from outside.  Use a white noise machine to block noise.  Keep the temperature cool.   Exercise regularly as directed by your health care provider. Avoid exercising right before bedtime.  Use relaxation techniques to manage stress. Ask your health care provider to suggest some techniques that may work well for you. These may include:  Breathing exercises.  Routines to release muscle tension.  Visualizing peaceful scenes.  Cut back on alcohol, caffeinated beverages, and cigarettes, especially close to bedtime. These can disrupt your sleep.  Do not overeat or eat spicy foods right before bedtime. This can lead to digestive discomfort that can make it hard for you to sleep.  Limit screen use before bedtime. This includes:  Watching TV.  Using your smartphone, tablet, and computer.  Stick to a routine. This   can help you fall asleep faster. Try to do a quiet activity, brush your teeth, and go to bed at the same time each night.  Get out of bed if you are still awake after 15 minutes of trying to sleep. Keep the lights down, but try reading or  doing a quiet activity. When you feel sleepy, go back to bed.  Make sure that you drive carefully. Avoid driving if you feel very sleepy.  Keep all follow-up appointments as directed by your health care provider. This is important. SEEK MEDICAL CARE IF:   You are tired throughout the day or have trouble in your daily routine due to sleepiness.  You continue to have sleep problems or your sleep problems get worse. SEEK IMMEDIATE MEDICAL CARE IF:   You have serious thoughts about hurting yourself or someone else.   This information is not intended to replace advice given to you by your health care provider. Make sure you discuss any questions you have with your health care provider.   Document Released: 06/21/2000 Document Revised: 03/15/2015 Document Reviewed: 03/25/2014 Elsevier Interactive Patient Education 2016 [employer]  

## 2015-12-22 LAB — PTH, INTACT AND CALCIUM
CALCIUM: 8.3 mg/dL — AB (ref 8.4–10.5)
PTH: 55 pg/mL (ref 14–64)

## 2015-12-25 ENCOUNTER — Other Ambulatory Visit: Payer: Self-pay | Admitting: Internal Medicine

## 2015-12-25 ENCOUNTER — Encounter: Payer: Self-pay | Admitting: Internal Medicine

## 2016-01-01 MED FILL — clonazePAM 0.5 MG TABS: 0.5 | 30 days supply | Qty: 60 | Fill #1

## 2016-01-19 MED FILL — DEXTROAMP-AMP 10 MG TAB: 10 | 30 days supply | Qty: 60 | Fill #0

## 2016-01-29 ENCOUNTER — Encounter: Payer: Self-pay | Admitting: Endocrinology

## 2016-01-29 ENCOUNTER — Ambulatory Visit (INDEPENDENT_AMBULATORY_CARE_PROVIDER_SITE_OTHER): Payer: 59 | Admitting: Endocrinology

## 2016-01-29 NOTE — Progress Notes (Signed)
Patient ID: Janice Spencer, female   DOB: 1966/01/26, 50 y.o.   MRN: [id number]            Chief complaint: Low calcium level  Referring physician: Dr. Scarlette Calico  History of Present Illness:  Her calcium was mildly low on her routine exam in 6/17 She thinks she has had occasional muscle spasms in her feet and these have been present for about 6 months Symptoms occur only about 2 or 3 times a week She does not usually have cramping in her hands or trunk area. No symptoms of tingling, numbness around her mouth or face. About 6 months ago she started taking vitamin D3 on her own as she thought it might help her skin and general benefits She takes 5000 units vitamin D3, taking these capsules at bedtime Does not take any supplemental calcium, does think that she has some dairy products in her diet  She has had more detail evaluation done as follows:   Lab Results  Component Value Date   CALCIUM 8.3 (L) 12/20/2015   CALCIUM 8.5 (L) 12/13/2015   CALCIUM 8.8 07/18/2014   CALCIUM 8.9 10/18/2011    PTH level 55, vitamin D level normalIn 6/17    Past Medical History:  Diagnosis Date  . Anxiety   . Depression   . Endometriosis   . IBS (irritable bowel syndrome)     Past Surgical History:  Procedure Laterality Date  . CHOLECYSTECTOMY    . nasal bone spur removed    . VAGINAL HYSTERECTOMY      Family History  Problem Relation Age of Onset  . Hypertension Mother   . Goiter Mother   . Heart disease Father   . Cancer Neg Hx   . Alcohol abuse Neg Hx   . Stroke Neg Hx     Social History:  reports that she quit smoking about 7 years ago. She smoked 0.25 packs per day. She has never used smokeless tobacco. She reports that she drinks about 1.2 oz of alcohol per week . She reports that she does not use drugs.  Allergies:  Allergies  Allergen Reactions  . Cymbalta [Duloxetine Hcl]     syncope  . Flagyl [Metronidazole]     rash  . Codeine   . Morphine   .  Penicillins       Medication List       Accurate as of 01/29/16  5:04 PM. Always use your most recent med list.          amphetamine-dextroamphetamine 10 MG tablet Commonly known as:  ADDERALL Take 10 mg by mouth daily with breakfast.   clonazePAM 1 MG tablet Commonly known as:  KLONOPIN Take 1 mg by mouth at bedtime as needed.   cyclobenzaprine 10 MG tablet Commonly known as:  FLEXERIL Take 0.5-1 tablets (5-10 mg total) by mouth 3 (three) times daily as needed for muscle spasms.   pregabalin 75 MG capsule Commonly known as:  LYRICA Take 1 capsule (75 mg total) by mouth 2 (two) times daily.   Suvorexant 20 MG Tabs Commonly known as:  BELSOMRA Take 1 tablet by mouth at bedtime as needed.       LABS:  No visits with results within 1 Week(s) from this visit.  Latest known visit with results is:  Office Visit on 12/20/2015  Component Date Value Ref Range Status  . HM Mammogram 03/21/2015 Self Reported Normal  0-4 Bi-Rad, Self Reported Normal Final  . HM Pap smear  01/12/2015 GYN   Final           Review of Systems  Constitutional: Positive for weight gain.  Cardiovascular: Negative for leg swelling.  Gastrointestinal: Positive for diarrhea.       Has IBS, has intermittent diarrhea to 6 times a day but not regularly.  Generally food related  Musculoskeletal: Positive for joint pain and muscle cramps.  Neurological: Positive for tingling.       Has mild long-standing tingling feeling in her toes.  Left hand may tingly she is sitting or lying in certain positions  Psychiatric/Behavioral: Positive for insomnia.       Takes Klonopin   Lab Results  Component Value Date   TSH 1.60 12/13/2015     PHYSICAL EXAM:  BP 102/68 (BP Location: Left Arm, Patient Position: Sitting)   Pulse 86   Wt 135 lb (61.2 kg)   SpO2 98%   BMI 23.17 kg/m   GENERAL: Averagely built and nourished   No pallor, clubbing, lymphadenopathy or leg edema.   Skin:  no rash or  pigmentation.  EYES:  Externally normal.   ENT: Oral mucosa and tongue normal.  THYROID:  Not palpable.  HEART:  Normal  S1 and S2; no murmur or click.  CHEST:  Normal shape.  Lungs: Vescicular breath sounds heard equally.  No crepitations/ wheeze.  ABDOMEN:  No distention.  Liver and spleen not palpable.  No other mass or tenderness.  NEUROLOGICAL: Chvostek sign negative..Reflexes are bilaterally normal at biceps  JOINTS:  Normal.   ASSESSMENT:    Mild hypocalcemia Not clear if this is symptomatic.  Has occasional cramping in her feet and Muscles, possibly unrelated Also need to determine whether her ionized calcium is low to confirm that she has hypocalcemia Currently etiology of hypocalcemia is not evident with her having normal vitamin D and parathyroid hormone levels No evidence of renal disease, hypomagnesemia or recent change in diet  She is currently  on 5000 units of vitamin D supplementation on her own although not taking any calcium supplements She may have occasional malabsorption with her diarrhea causing relatively low calcium levels    PLAN:    Check 1, 25-hydroxy vitamin D and ionized calcium levels. If her ionized calcium is low as well as 1, 25-hydroxy vitamin D may try calcitriol low doses Follow-up to be determined based on her labs   [facility] 01/29/2016, 5:04 PM

## 2016-01-31 ENCOUNTER — Other Ambulatory Visit: Payer: Self-pay | Admitting: Family

## 2016-01-31 ENCOUNTER — Encounter: Payer: Self-pay | Admitting: Internal Medicine

## 2016-01-31 DIAGNOSIS — S39012A Strain of muscle, fascia and tendon of lower back, initial encounter: Secondary | ICD-10-CM

## 2016-01-31 MED ORDER — CYCLOBENZAPRINE HCL 10 MG PO TABS: 5.0000 mg | ORAL_TABLET | Freq: Three times a day (TID) | ORAL | 3 refills | Status: DC | PRN

## 2016-01-31 MED FILL — CYCLOBENZAPRINE 10 MG TAB: 10 | 10 days supply | Qty: 30 | Fill #0

## 2016-01-31 NOTE — Telephone Encounter (Signed)
done

## 2016-02-01 ENCOUNTER — Other Ambulatory Visit: Payer: Self-pay | Admitting: Internal Medicine

## 2016-02-01 ENCOUNTER — Encounter: Payer: Self-pay | Admitting: Endocrinology

## 2016-02-01 DIAGNOSIS — S39012D Strain of muscle, fascia and tendon of lower back, subsequent encounter: Secondary | ICD-10-CM

## 2016-02-01 DIAGNOSIS — M797 Fibromyalgia: Secondary | ICD-10-CM

## 2016-02-01 MED ORDER — TRAMADOL HCL 50 MG PO TABS: 50.0000 mg | ORAL_TABLET | Freq: Three times a day (TID) | ORAL | 3 refills | Status: DC | PRN

## 2016-02-01 MED FILL — traMADol HCL 50 MG TABS: 50 | 20 days supply | Qty: 60 | Fill #0

## 2016-02-02 LAB — VITAMIN D 1,25 DIHYDROXY
VITAMIN D 1, 25 (OH) TOTAL: 114 pg/mL
Vitamin D3 1, 25 (OH)2: 114 pg/mL

## 2016-02-02 LAB — CALCIUM, IONIZED: Calcium, Ion: 5 mg/dL (ref 4.5–5.6)

## 2016-02-03 NOTE — Progress Notes (Signed)
Please let patient know that the ionized calcium is normal, since active vitamin D level is high she needs to cut down her vitamin D supplement down to 3 times a week instead of daily Also recommend taking 500 mg of calcium daily Follow-up in 3 months with labs

## 2016-02-07 ENCOUNTER — Telehealth: Payer: Self-pay

## 2016-02-07 NOTE — Telephone Encounter (Signed)
Called and spoke with patient about lab results, advised of medication changes. Patient stated she would call back to make appointment with him in 3 months with labs. No other questions or concerns.

## 2016-02-09 DIAGNOSIS — L821 Other seborrheic keratosis: Secondary | ICD-10-CM | POA: Diagnosis not present

## 2016-02-09 DIAGNOSIS — D225 Melanocytic nevi of trunk: Secondary | ICD-10-CM | POA: Diagnosis not present

## 2016-02-09 DIAGNOSIS — L219 Seborrheic dermatitis, unspecified: Secondary | ICD-10-CM | POA: Diagnosis not present

## 2016-02-09 DIAGNOSIS — L814 Other melanin hyperpigmentation: Secondary | ICD-10-CM | POA: Diagnosis not present

## 2016-02-09 MED FILL — FLUTICASONE PROP 0.05% CRM: 0.05 | 10 days supply | Qty: 30 | Fill #0

## 2016-02-19 NOTE — Telephone Encounter (Signed)
Please confirm that she has read the following my chart message: Results have been recently received and now released, briefly your active vitamin D level is above normal, reduce vitamin D3 supplement to 3 times a week and add calcium 500 mg daily since you need this after age 50. Let's see you back in 3 months with labs 3 month appointment with renal panel, vitamin D 25-hydroxy and 1, 25-hydroxy D levels needed

## 2016-02-21 MED FILL — clonazePAM 0.5 MG TABS: 0.5 | 30 days supply | Qty: 60 | Fill #2

## 2016-03-28 MED FILL — DEXTROAMP-AMPHETAMIN 10 MG: 10 | 30 days supply | Qty: 60 | Fill #0

## 2016-03-28 MED FILL — traMADol HCL 50 MG TABS: 50 | 20 days supply | Qty: 60 | Fill #1

## 2016-03-28 MED FILL — clonazePAM 1 MG TABS: 1 | 90 days supply | Qty: 90 | Fill #0

## 2016-03-28 MED FILL — CYCLOBENZAPRINE 10 MG TAB: 10 | 10 days supply | Qty: 30 | Fill #1

## 2016-03-28 MED FILL — LYRICA 75 MG CAPSULE: 75 | 90 days supply | Qty: 360 | Fill #1

## 2016-04-04 DIAGNOSIS — N951 Menopausal and female climacteric states: Secondary | ICD-10-CM | POA: Diagnosis not present

## 2016-04-04 MED FILL — ESTRADIOL 0.5 MG TABLET: 0.5 | 30 days supply | Qty: 30 | Fill #0

## 2016-04-08 ENCOUNTER — Encounter: Payer: Self-pay | Admitting: Family

## 2016-04-08 ENCOUNTER — Ambulatory Visit (INDEPENDENT_AMBULATORY_CARE_PROVIDER_SITE_OTHER): Payer: 59 | Admitting: Family

## 2016-04-08 VITALS — BP 106/53 | HR 84 | Temp 98.4°F | Resp 18 | Ht 64.0 in | Wt 134.2 lb

## 2016-04-08 DIAGNOSIS — R21 Rash and other nonspecific skin eruption: Secondary | ICD-10-CM | POA: Diagnosis not present

## 2016-04-08 MED ORDER — PREDNISONE 10 MG PO TABS: ORAL_TABLET | ORAL | 0 refills | Status: DC

## 2016-04-08 MED ORDER — METHYLPREDNISOLONE SODIUM SUCC 125 MG IJ SOLR
125.0000 mg | Freq: Once | INTRAMUSCULAR | Status: AC
  Administered 2016-04-08: 125 mg via INTRAMUSCULAR

## 2016-04-08 MED ORDER — EPINEPHRINE 0.3 MG/0.3ML IJ SOAJ: 0.3000 mg | Freq: Once | INTRAMUSCULAR | 1 refills | Status: AC

## 2016-04-08 MED FILL — predniSONE 10 MG TABS: 10 | 8 days supply | Qty: 20 | Fill #0

## 2016-04-08 MED FILL — EPINEPHRINE 0.3 MG AUTO-INJ: 0.3 | 30 days supply | Qty: 2 | Fill #0

## 2016-04-08 NOTE — Patient Instructions (Signed)
Begin Prednisone taper this evening. For itching, please begin benadryl 25mg  to 50mg  every 6 hours as needed. If severe shortness or breath/tongue/lip swelling, please inject the epipen and call 911. Call if symptoms are not improved in 24 hours.

## 2016-04-08 NOTE — Progress Notes (Signed)
Pre visit review using our clinic review tool, if applicable. No additional management support is needed unless otherwise documented below in the visit note. 

## 2016-04-08 NOTE — Progress Notes (Signed)
Subjective:    Patient ID: Janice Spencer, female    DOB: August 19, 1965, 50 y.o.   MRN: [id number]  HPI  [name] is a 50 yr old female who presents today with c/o rash. Rash has been present for 3 days.  She sees Dr. Lucille Passy for GYN who recently started her on estradiol for her menopausal symptoms. She reports that her rash initially began 4 hours after her first dose of estradiol. She repeated the dose the following day and the symptoms worsened. She denies SOB, tongue/lip swelling. Notes some improvement with zyrtec. She is allergic to kiwi however she has not eaten any Kiwi recently.  Review of Systems    see HPI  Past Medical History:  Diagnosis Date  . Anxiety   . Depression   . Endometriosis   . IBS (irritable bowel syndrome)      Social History   Social History  . Marital status: Divorced    Spouse name: N/A  . Number of children: N/A  . Years of education: N/A   Occupational History  . Not on file.   Social History Main Topics  . Smoking status: Former Smoker    Packs/day: 0.25    Quit date: 05/12/2008  . Smokeless tobacco: Never Used  . Alcohol use 1.2 oz/week    2 Glasses of wine per week  . Drug use: No  . Sexual activity: Not Currently   Other Topics Concern  . Not on file   Social History Narrative  . No narrative on file    Past Surgical History:  Procedure Laterality Date  . CHOLECYSTECTOMY    . nasal bone spur removed    . VAGINAL HYSTERECTOMY      Family History  Problem Relation Age of Onset  . Hypertension Mother   . Goiter Mother   . Heart disease Father   . Cancer Neg Hx   . Alcohol abuse Neg Hx   . Stroke Neg Hx     Allergies  Allergen Reactions  . Cymbalta [Duloxetine Hcl]     syncope  . Flagyl [Metronidazole]     rash  . Belsomra [Suvorexant] Other (See Comments)    "sleep paralysis"  . Codeine   . Morphine   . Penicillins     Current Outpatient Prescriptions on File Prior to Visit  Medication Sig  Dispense Refill  . amphetamine-dextroamphetamine (ADDERALL) 10 MG tablet Take 10 mg by mouth daily with breakfast.    . clonazePAM (KLONOPIN) 1 MG tablet Take 1 mg by mouth at bedtime as needed.      . cyclobenzaprine (FLEXERIL) 10 MG tablet Take 0.5-1 tablets (5-10 mg total) by mouth 3 (three) times daily as needed for muscle spasms. 30 tablet 3  . pregabalin (LYRICA) 75 MG capsule Take 1 capsule (75 mg total) by mouth 2 (two) times daily. 180 capsule 1  . traMADol (ULTRAM) 50 MG tablet Take 1 tablet (50 mg total) by mouth every 8 (eight) hours as needed. 60 tablet 3   No current facility-administered medications on file prior to visit.     BP (!) 106/53 (BP Location: Right Arm, Cuff Size: Normal)   Pulse 84   Temp 98.4 F (36.9 C) (Oral)   Resp 18   Ht 5\' 4"  (1.626 m)   Wt 134 lb 3.2 oz (60.9 kg)   SpO2 100% Comment: room air  BMI 23.04 kg/m    Objective:   Physical Exam  Constitutional: She appears well-developed and  well-nourished.  HENT:  No tongue or lip swelling is noted.   Cardiovascular: Normal rate, regular rhythm and normal heart sounds.   No murmur heard. Pulmonary/Chest: Effort normal and breath sounds normal. No respiratory distress. She has no wheezes.  Skin: Skin is warm and dry.  + urticarial rash noted on arms/legs/trunk, chin   Psychiatric: She has a normal mood and affect. Her behavior is normal. Judgment and thought content normal.          Assessment & Plan:  Urticaria- etiology unclear. Pt was treated with IM solumedrol today. This will be followed by pred taper. Will also refer to allergist for further evaluation and give patient rx for epipen in case of tongue/lip swelling.   Pt is advised as follows:  Begin Prednisone taper this evening. For itching, please begin benadryl 25mg  to 50mg  every 6 hours as needed. If severe shortness or breath/tongue/lip swelling, please inject the epipen and call 911. Call if symptoms are not improved in 24 hours.     Addendum: phoned pt on 10/3. She states still itching. Has not taken prednisone yet. Advised her to take prednisone this am, continue benadryl, let us know if symptoms are not improved by tomorrow.  Will also place referral to allergist.

## 2016-04-09 ENCOUNTER — Encounter: Payer: Self-pay | Admitting: Family

## 2016-04-12 ENCOUNTER — Encounter: Payer: Self-pay | Admitting: Internal Medicine

## 2016-04-12 ENCOUNTER — Encounter: Payer: Self-pay | Admitting: Family

## 2016-04-12 MED ORDER — PERMETHRIN 5 % EX CREA: TOPICAL_CREAM | CUTANEOUS | 0 refills | Status: DC

## 2016-04-16 DIAGNOSIS — M25562 Pain in left knee: Secondary | ICD-10-CM | POA: Diagnosis not present

## 2016-04-16 LAB — HM MAMMOGRAPHY: HM Mammogram: NORMAL (ref 0–4)

## 2016-04-17 DIAGNOSIS — L0100 Impetigo, unspecified: Secondary | ICD-10-CM | POA: Diagnosis not present

## 2016-04-17 DIAGNOSIS — L270 Generalized skin eruption due to drugs and medicaments taken internally: Secondary | ICD-10-CM | POA: Diagnosis not present

## 2016-04-17 DIAGNOSIS — L219 Seborrheic dermatitis, unspecified: Secondary | ICD-10-CM | POA: Diagnosis not present

## 2016-04-17 LAB — HM PAP SMEAR

## 2016-04-17 MED FILL — MUPIROCIN 2% OINTMENT: 2 | 14 days supply | Qty: 22 | Fill #0

## 2016-04-17 MED FILL — CLOBETASOL 0.05% SHAMPOO: 0.05 | 30 days supply | Qty: 118 | Fill #0

## 2016-04-25 MED FILL — SULFAMETHOXAZOLE/TMP DS TAB: 800-160 | 10 days supply | Qty: 20 | Fill #0

## 2016-04-25 MED FILL — FLUCONAZOLE 150 MG TABLET: 150 | 7 days supply | Qty: 2 | Fill #0

## 2016-04-29 MED FILL — DEXTROAMP-AMP 10 MG TAB: 10 | 30 days supply | Qty: 60 | Fill #0

## 2016-04-29 MED FILL — ESTRADIOL 0.5 MG TABLET: 0.5 | 30 days supply | Qty: 30 | Fill #1

## 2016-05-02 DIAGNOSIS — M25562 Pain in left knee: Secondary | ICD-10-CM | POA: Diagnosis not present

## 2016-05-02 DIAGNOSIS — G8929 Other chronic pain: Secondary | ICD-10-CM | POA: Diagnosis not present

## 2016-05-09 DIAGNOSIS — F330 Major depressive disorder, recurrent, mild: Secondary | ICD-10-CM | POA: Diagnosis not present

## 2016-06-11 DIAGNOSIS — Z01419 Encounter for gynecological examination (general) (routine) without abnormal findings: Secondary | ICD-10-CM | POA: Diagnosis not present

## 2016-06-11 DIAGNOSIS — Z6823 Body mass index (BMI) 23.0-23.9, adult: Secondary | ICD-10-CM | POA: Diagnosis not present

## 2016-06-11 DIAGNOSIS — Z1231 Encounter for screening mammogram for malignant neoplasm of breast: Secondary | ICD-10-CM | POA: Diagnosis not present

## 2016-06-11 MED FILL — ESTRADIOL 0.5 MG TABLET: 0.5 | 90 days supply | Qty: 90 | Fill #0

## 2016-06-12 MED FILL — DEXTROAMP-AMP 10 MG TAB: 10 | 30 days supply | Qty: 60 | Fill #0

## 2016-06-24 MED FILL — clonazePAM 1 MG TABS: 1 | 90 days supply | Qty: 90 | Fill #1

## 2016-06-24 MED FILL — traMADol HCL 50 MG TABS: 50 | 20 days supply | Qty: 60 | Fill #2

## 2016-06-24 MED FILL — CLOBETASOL 0.05% SHAMPOO: 0.05 | 30 days supply | Qty: 118 | Fill #1

## 2016-06-24 MED FILL — CYCLOBENZAPRINE 10 MG TAB: 10 | 10 days supply | Qty: 30 | Fill #2

## 2016-06-26 ENCOUNTER — Telehealth: Payer: Self-pay

## 2016-06-26 MED FILL — LYRICA 75 MG CAPSULE: 75 | 90 days supply | Qty: 360 | Fill #0

## 2016-06-26 NOTE — Telephone Encounter (Signed)
Form to fill out stating that she has a dx that contraindicates the flu vaccine.   PCP has signed this form and it has been faxed back to Employee health.

## 2016-07-04 ENCOUNTER — Telehealth: Payer: Self-pay | Admitting: Internal Medicine

## 2016-07-04 NOTE — Telephone Encounter (Signed)
Sent from pt via [facility] email 06/28/16: "Just a question - per [employer] [facility] [employer] Marvell Fuller, RN) stated, the Medical Director denied the exemption previously requested for DX: fibromyalgia alone.  When I met with her on 12/20 she said the form needed to include the extent of my symptoms (worsening with trauma, stress, weather changes-rain/snow/cold), importance of removing/decreasing/avoiding triggers (trauma, needle sticks etc) as much as possible.  She also said to please include the fact that I am having new allergic reactions to medications even those I have taken for years but filled by a different manufacturer using different color dyes/fillers.  I am now having to carry epi-pens with me because the latest reaction was so severe.  It truly scares me to think about receiving any type of new medication/injections because of this. I did receive the form back from you but it only had the DX of Fibromyalgia with no further explanation which they are requiring and none of the allergy information had been documented.  Can this form PLEASE be completed with the information requested so that the exemption will be considered/not be denied again?"   07/04/16 Patient has been informed that Dr. Ronnald Ramp reviewed this and her previous emails and will not change the completion of the flu exemption form, only fibromyalgia will be listed as a potential reason for an exemption.

## 2016-08-21 MED FILL — DEXTROAMP-AMPHETAMIN 10 MG: 10 | 30 days supply | Qty: 60 | Fill #0

## 2016-09-12 MED FILL — ESTRADIOL 0.5 MG TABLET: 0.5 | 90 days supply | Qty: 90 | Fill #1

## 2016-10-01 ENCOUNTER — Ambulatory Visit (INDEPENDENT_AMBULATORY_CARE_PROVIDER_SITE_OTHER): Payer: 59 | Admitting: Internal Medicine

## 2016-10-01 ENCOUNTER — Encounter: Payer: Self-pay | Admitting: Internal Medicine

## 2016-10-01 VITALS — BP 110/62 | HR 75 | Temp 98.3°F | Resp 14 | Ht 64.0 in | Wt 130.0 lb

## 2016-10-01 DIAGNOSIS — J019 Acute sinusitis, unspecified: Secondary | ICD-10-CM | POA: Insufficient documentation

## 2016-10-01 MED ORDER — LEVOFLOXACIN 500 MG PO TABS: 500.0000 mg | ORAL_TABLET | Freq: Every day | ORAL | 0 refills | Status: AC

## 2016-10-01 NOTE — Assessment & Plan Note (Signed)
Mild to mod, for antibx course,  to f/u any worsening symptoms or concerns 

## 2016-10-01 NOTE — Patient Instructions (Signed)
Please take all new medication as prescribed-  The antibiotic  You can also take Delsym OTC for cough, and/or Mucinex (or it's generic off brand) for congestion, and tylenol as needed for pain.  Please continue all other medications as before, and refills have been done if requested.  Please have the pharmacy call with any other refills you may need.  Please keep your appointments with your specialists as you may have planned

## 2016-10-01 NOTE — Progress Notes (Signed)
Pre visit review using our clinic review tool, if applicable. No additional management support is needed unless otherwise documented below in the visit note. 

## 2016-10-01 NOTE — Progress Notes (Signed)
   Subjective:    Patient ID: Janice Spencer, female    DOB: 1965-07-21, 51 y.o.   MRN: [id number]  HPI  Here with 2-3 days acute onset fever, left ear pain, facial pain, pressure, headache, general weakness and malaise, and greenish d/c, with mild ST and cough, but pt denies chest pain, wheezing, increased sob or doe, orthopnea, PND, increased LE swelling, palpitations, dizziness or syncope. Past Medical History:  Diagnosis Date  . Anxiety   . Depression   . Endometriosis   . IBS (irritable bowel syndrome)    Past Surgical History:  Procedure Laterality Date  . CHOLECYSTECTOMY    . nasal bone spur removed    . VAGINAL HYSTERECTOMY      reports that she quit smoking about 8 years ago. She smoked 0.25 packs per day. She has never used smokeless tobacco. She reports that she drinks about 1.2 oz of alcohol per week . She reports that she does not use drugs. family history includes Goiter in her mother; Heart disease in her father; Hypertension in her mother. Allergies  Allergen Reactions  . Cymbalta [Duloxetine Hcl]     syncope  . Flagyl [Metronidazole]     rash  . Belsomra [Suvorexant] Other (See Comments)    "sleep paralysis"  . Codeine   . Morphine   . Penicillins    Review of Systems All otherwise neg per pt    Objective:   Physical Exam BP 110/62 (BP Location: Left Arm, Patient Position: Sitting, Cuff Size: Normal)   Pulse 75   Temp 98.3 F (36.8 C) (Oral)   Resp 14   Ht 5\' 4"  (1.626 m)   Wt 130 lb (59 kg)   SpO2 100%   BMI 22.31 kg/m  VS noted, mild ill Constitutional: Pt appears in no apparent distress HENT: Head: NCAT.  Right Ear: External ear normal.  Left Ear: External ear normal.  Eyes: . Pupils are equal, round, and reactive to light. Conjunctivae and EOM are normal Bilat tm's with mild erythema.  Max sinus areas mild tender.  Pharynx with mild erythema, no exudate Neck: Normal range of motion. Neck supple.  Cardiovascular: Normal rate and regular  rhythm.   Pulmonary/Chest: Effort normal and breath sounds without rales or wheezing.  Neurological: Pt is alert. Not confused , motor grossly intact Skin: Skin is warm. No rash, no LE edema Psychiatric: Pt behavior is normal. No agitation.  No other exam findings    Assessment & Plan:

## 2016-10-02 MED FILL — levoFLOXacin 500 MG TABS: 500 | 10 days supply | Qty: 10 | Fill #0

## 2016-10-07 MED FILL — clonazePAM 1 MG TABS: 1 | 90 days supply | Qty: 90 | Fill #0

## 2016-10-11 MED FILL — AMPHETAMINE SALTS 10 MG TAB: 10 | 30 days supply | Qty: 60 | Fill #0

## 2016-12-09 MED FILL — CYCLOBENZAPRINE 10 MG TAB: 10 | 10 days supply | Qty: 30 | Fill #3

## 2016-12-09 MED FILL — ESTRADIOL 0.5 MG TABLET: 0.5 | 90 days supply | Qty: 90 | Fill #2

## 2016-12-30 MED FILL — clonazePAM 1 MG TABS: 1 | 90 days supply | Qty: 90 | Fill #0

## 2016-12-30 MED FILL — DEXTROAMP-AMP 10 MG TAB: 10 | 30 days supply | Qty: 60 | Fill #0

## 2017-03-03 ENCOUNTER — Other Ambulatory Visit: Payer: Self-pay | Admitting: Internal Medicine

## 2017-03-03 DIAGNOSIS — S39012D Strain of muscle, fascia and tendon of lower back, subsequent encounter: Secondary | ICD-10-CM

## 2017-03-03 DIAGNOSIS — M797 Fibromyalgia: Secondary | ICD-10-CM

## 2017-03-03 DIAGNOSIS — S39012A Strain of muscle, fascia and tendon of lower back, initial encounter: Secondary | ICD-10-CM

## 2017-03-03 MED FILL — CYCLOBENZAPRINE 10 MG TAB: 10 | 10 days supply | Qty: 30 | Fill #0

## 2017-03-03 MED FILL — traMADol HCL 50 MG TABS: 50 | 20 days supply | Qty: 60 | Fill #0

## 2017-03-03 MED FILL — AMPHETAMINE SALTS 10 MG TAB: 10 | 30 days supply | Qty: 60 | Fill #0

## 2017-03-03 NOTE — Telephone Encounter (Signed)
Faxed script back to [facility]..../lmb  

## 2017-03-06 ENCOUNTER — Telehealth: Payer: 59 | Admitting: Physician Assistant

## 2017-03-06 DIAGNOSIS — J019 Acute sinusitis, unspecified: Secondary | ICD-10-CM | POA: Diagnosis not present

## 2017-03-06 DIAGNOSIS — B9789 Other viral agents as the cause of diseases classified elsewhere: Secondary | ICD-10-CM

## 2017-03-06 MED ORDER — AZELASTINE HCL 0.1 % NA SOLN: 2.0000 | Freq: Two times a day (BID) | NASAL | 0 refills | Status: DC

## 2017-03-06 NOTE — Progress Notes (Signed)

## 2017-03-13 MED FILL — clonazePAM 1 MG TABS: 1 | 90 days supply | Qty: 90 | Fill #1

## 2017-03-13 MED FILL — ESTRADIOL 0.5 MG TABLET: 0.5 | 90 days supply | Qty: 90 | Fill #3

## 2017-04-01 ENCOUNTER — Encounter: Payer: Self-pay | Admitting: Internal Medicine

## 2017-04-01 ENCOUNTER — Other Ambulatory Visit (INDEPENDENT_AMBULATORY_CARE_PROVIDER_SITE_OTHER): Payer: 59

## 2017-04-01 ENCOUNTER — Ambulatory Visit (INDEPENDENT_AMBULATORY_CARE_PROVIDER_SITE_OTHER): Payer: 59 | Admitting: Internal Medicine

## 2017-04-01 ENCOUNTER — Telehealth: Payer: Self-pay | Admitting: Internal Medicine

## 2017-04-01 VITALS — BP 100/64 | HR 83 | Temp 98.9°F | Resp 16 | Wt 129.0 lb

## 2017-04-01 DIAGNOSIS — M797 Fibromyalgia: Secondary | ICD-10-CM

## 2017-04-01 DIAGNOSIS — Z0000 Encounter for general adult medical examination without abnormal findings: Secondary | ICD-10-CM | POA: Diagnosis not present

## 2017-04-01 LAB — COMPREHENSIVE METABOLIC PANEL
ALBUMIN: 4.2 g/dL (ref 3.5–5.2)
ALK PHOS: 51 U/L (ref 39–117)
ALT: 9 U/L (ref 0–35)
AST: 14 U/L (ref 0–37)
BUN: 9 mg/dL (ref 6–23)
CALCIUM: 9.2 mg/dL (ref 8.4–10.5)
CHLORIDE: 105 meq/L (ref 96–112)
CO2: 31 mEq/L (ref 19–32)
CREATININE: 0.84 mg/dL (ref 0.40–1.20)
GFR: 75.99 mL/min (ref >60.00)
Glucose, Bld: 86 mg/dL (ref 70–99)
POTASSIUM: 4 meq/L (ref 3.5–5.1)
SODIUM: 141 meq/L (ref 135–145)
TOTAL PROTEIN: 6.5 g/dL (ref 6.0–8.3)
Total Bilirubin: 0.7 mg/dL (ref 0.2–1.2)

## 2017-04-01 LAB — LIPID PANEL
CHOLESTEROL: 201 mg/dL — AB (ref 0–200)
HDL: 69 mg/dL (ref >39.00)
LDL CALC: 118 mg/dL — AB (ref 0–99)
NonHDL: 132.01
TRIGLYCERIDES: 72 mg/dL (ref 0.0–149.0)
Total CHOL/HDL Ratio: 3
VLDL: 14.4 mg/dL (ref 0.0–40.0)

## 2017-04-01 LAB — THYROID PANEL WITH TSH
FREE THYROXINE INDEX: 2.4 (ref 1.4–3.8)
T3 UPTAKE: 29 % (ref 22–35)
T4, Total: 8.2 ug/dL (ref 5.1–11.9)
TSH: 0.84 m[IU]/L

## 2017-04-01 LAB — CBC WITH DIFFERENTIAL/PLATELET
BASOS PCT: 0.7 % (ref 0.0–3.0)
Basophils Absolute: 0 10*3/uL (ref 0.0–0.1)
EOS ABS: 0.2 10*3/uL (ref 0.0–0.7)
EOS PCT: 3 % (ref 0.0–5.0)
HEMATOCRIT: 42 % (ref 36.0–46.0)
HEMOGLOBIN: 14 g/dL (ref 12.0–15.0)
LYMPHS PCT: 39.3 % (ref 12.0–46.0)
Lymphs Abs: 2 10*3/uL (ref 0.7–4.0)
MCHC: 33.3 g/dL (ref 30.0–36.0)
MCV: 90.8 fl (ref 78.0–100.0)
MONO ABS: 0.4 10*3/uL (ref 0.1–1.0)
Monocytes Relative: 8.4 % (ref 3.0–12.0)
Neutro Abs: 2.5 10*3/uL (ref 1.4–7.7)
Neutrophils Relative %: 48.6 % (ref 43.0–77.0)
Platelets: 233 10*3/uL (ref 150.0–400.0)
RBC: 4.63 Mil/uL (ref 3.87–5.11)
RDW: 13 % (ref 11.5–15.5)
WBC: 5.1 10*3/uL (ref 4.0–10.5)

## 2017-04-01 LAB — CK: CK TOTAL: 70 U/L (ref 7–177)

## 2017-04-01 MED ORDER — NALTREXONE HCL POWD: 5.0000 mg | Freq: Every day | 0 refills | Status: DC

## 2017-04-01 NOTE — Telephone Encounter (Signed)
lvm for pharmacy to call back.

## 2017-04-01 NOTE — Telephone Encounter (Signed)
Pharmacy called regarding the patients Naltrexone HCl POWD  They did not understand the instructions and states that they do have this in a tablet,  Please advise and call back  [phone] [facility]

## 2017-04-01 NOTE — Telephone Encounter (Signed)
Pt wants to try low dose naltrexone for fibromyalgia This will need to be compounded

## 2017-04-01 NOTE — Telephone Encounter (Signed)
[facility] called back and informed that compounds were sent over to [facility].   Resent rx to [facility]

## 2017-04-01 NOTE — Patient Instructions (Signed)

## 2017-04-01 NOTE — Progress Notes (Signed)
Subjective:  Patient ID: Janice Spencer, female    DOB: 05/02/1966  Age: 51 y.o. MRN: [id number]  CC: Annual Exam   HPI Janice Spencer presents for a CPX.  She has done some research recently and his found some evidence that low-dose naltrexone may help for fibromyalgia in that it raises endorphin levels. She wants to try the 5 mg dose in addition to taking the Lyrica and tramadol.  Outpatient Medications Prior to Visit  Medication Sig Dispense Refill  . amphetamine-dextroamphetamine (ADDERALL) 10 MG tablet Take 10 mg by mouth daily with breakfast.    . clonazePAM (KLONOPIN) 1 MG tablet Take 1 mg by mouth at bedtime as needed.      . cyclobenzaprine (FLEXERIL) 10 MG tablet TAKE 1/2-1 TABLET BY MOUTH 3 TIMES DAILY AS NEEDED FOR MUSCLE SPASMS. 30 tablet 3  . permethrin (ACTICIN) 5 % cream Thoroughly massage cream from head to soles of feet; leave on for 8 to 14 hours before showering 60 g 0  . pregabalin (LYRICA) 75 MG capsule Take 1 capsule (75 mg total) by mouth 2 (two) times daily. 180 capsule 1  . traMADol (ULTRAM) 50 MG tablet TAKE 1 TABLET BY MOUTH EVERY 8 HOURS AS NEEDED 60 tablet 3  . azelastine (ASTELIN) 0.1 % nasal spray Place 2 sprays into both nostrils 2 (two) times daily. Use in each nostril as directed 30 mL 0  . predniSONE (DELTASONE) 10 MG tablet 4 tabs by mouth once daily for 2 days, then 3 tabs daily x 2 days, then 2 tabs daily x 2 days, then 1 tab daily x 2 days 20 tablet 0   No facility-administered medications prior to visit.     ROS Review of Systems  Constitutional: Positive for fatigue. Negative for appetite change, chills, diaphoresis and unexpected weight change.  HENT: Negative.   Eyes: Negative.   Respiratory: Negative.  Negative for cough, chest tightness, shortness of breath and wheezing.   Cardiovascular: Negative for chest pain, palpitations and leg swelling.  Gastrointestinal: Negative for abdominal pain, constipation, diarrhea, nausea and  vomiting.  Endocrine: Negative.   Genitourinary: Negative.  Negative for difficulty urinating.  Musculoskeletal: Positive for arthralgias and myalgias. Negative for neck pain.  Skin: Negative.  Negative for color change.  Allergic/Immunologic: Negative.   Neurological: Negative.  Negative for dizziness and headaches.  Hematological: Negative for adenopathy. Does not bruise/bleed easily.  Psychiatric/Behavioral: Positive for dysphoric mood and sleep disturbance. Negative for behavioral problems, confusion, decreased concentration, hallucinations, self-injury and suicidal ideas. The patient is not nervous/anxious and is not hyperactive.     Objective:  BP 100/64   Pulse 83   Temp 98.9 F (37.2 C)   Resp 16   Wt 129 lb (58.5 kg)   SpO2 99%   BMI 22.14 kg/m   BP Readings from Last 3 Encounters:  04/01/17 100/64  10/01/16 110/62  04/08/16 (!) 106/53    Wt Readings from Last 3 Encounters:  04/01/17 129 lb (58.5 kg)  10/01/16 130 lb (59 kg)  04/08/16 134 lb 3.2 oz (60.9 kg)    Physical Exam  Constitutional: She is oriented to person, place, and time. No distress.  HENT:  Mouth/Throat: Oropharynx is clear and moist. No oropharyngeal exudate.  Eyes: Conjunctivae are normal. Right eye exhibits no discharge. Left eye exhibits no discharge. No scleral icterus.  Neck: Normal range of motion. Neck supple. No JVD present. No thyromegaly present.  Cardiovascular: Normal rate, regular rhythm and intact distal pulses.  Exam reveals no gallop and no friction rub.   No murmur heard. Pulmonary/Chest: Effort normal and breath sounds normal. No respiratory distress. She has no wheezes. She has no rales. She exhibits no tenderness.  Abdominal: Soft. Bowel sounds are normal. She exhibits no distension and no mass. There is no tenderness. There is no rebound and no guarding.  Musculoskeletal: Normal range of motion. She exhibits no edema, tenderness or deformity.  Lymphadenopathy:    She has no  cervical adenopathy.  Neurological: She is alert and oriented to person, place, and time.  Skin: Skin is warm and dry. No rash noted. She is not diaphoretic. No erythema. No pallor.  Psychiatric: She has a normal mood and affect. Her behavior is normal. Judgment and thought content normal.  Vitals reviewed.   Lab Results  Component Value Date   WBC 5.1 04/01/2017   HGB 14.0 04/01/2017   HCT 42.0 04/01/2017   PLT 233.0 04/01/2017   GLUCOSE 86 04/01/2017   CHOL 201 (H) 04/01/2017   TRIG 72.0 04/01/2017   HDL 69.00 04/01/2017   LDLCALC 118 (H) 04/01/2017   ALT 9 04/01/2017   AST 14 04/01/2017   NA 141 04/01/2017   K 4.0 04/01/2017   CL 105 04/01/2017   CREATININE 0.84 04/01/2017   BUN 9 04/01/2017   CO2 31 04/01/2017   TSH 0.84 04/01/2017   HGBA1C 5.6 12/13/2015    No results found.  Assessment & Plan:   Alee was seen today for annual exam.  Diagnoses and all orders for this visit:  Fibromyalgia muscle pain- her lab work is negative for any secondary causes or complications. Will try low-dose naltrexone as requested. -     Discontinue: Naltrexone HCl POWD; 5 mg by Does not apply route daily. -     CK; Future -     Thyroid Panel With TSH; Future  Routine general medical examination at a health care facility- exam completed, she refused a flu vaccine, labs reviewed; screening for colon cancer, Pap, mammogram are all up-to-date; patient education material was given. -     Lipid panel; Future -     Comprehensive metabolic panel; Future -     CBC with Differential/Platelet; Future  Hypocalcemia- Her calcium level is normal now   I have discontinued Ms. Ashland's predniSONE and azelastine. I am also having her maintain her clonazePAM, pregabalin, amphetamine-dextroamphetamine, permethrin, traMADol, cyclobenzaprine, and estrogens (conjugated).  Meds ordered this encounter  Medications  . estrogens, conjugated, (PREMARIN) 1.25 MG tablet    Sig: Take 1.25 mg by mouth  daily.  Marland Kitchen DISCONTD: Naltrexone HCl POWD    Sig: 5 mg by Does not apply route daily.    Dispense:  1 g    Refill:  0     Follow-up: Return in about 3 months (around 07/01/2017).  Scarlette Calico, MD

## 2017-04-02 ENCOUNTER — Encounter: Payer: Self-pay | Admitting: Internal Medicine

## 2017-04-07 ENCOUNTER — Encounter: Payer: Self-pay | Admitting: Internal Medicine

## 2017-04-23 ENCOUNTER — Other Ambulatory Visit: Payer: Self-pay | Admitting: Internal Medicine

## 2017-04-23 DIAGNOSIS — M797 Fibromyalgia: Secondary | ICD-10-CM

## 2017-04-24 MED ORDER — NALTREXONE HCL POWD: 5.0000 mg | Freq: Every day | 0 refills | Status: DC

## 2017-04-24 NOTE — Telephone Encounter (Signed)
Per PCP sent in 90 day supply.

## 2017-04-29 MED FILL — AMPHETAMINE SALTS 10 MG TAB: 10 | 30 days supply | Qty: 60 | Fill #0

## 2017-06-19 MED FILL — ESTRADIOL 0.5 MG TABLET: 0.5 | 90 days supply | Qty: 90 | Fill #0

## 2017-07-02 MED FILL — clonazePAM 1 MG TABS: 1 | 30 days supply | Qty: 60 | Fill #0

## 2017-07-02 MED FILL — AMPHETAMINE-DEXTROAMPHETAMI: 10 | 30 days supply | Qty: 60 | Fill #0

## 2017-07-22 LAB — HM MAMMOGRAPHY: HM MAMMO: NORMAL (ref 0–4)

## 2017-07-22 LAB — HM PAP SMEAR

## 2017-07-24 ENCOUNTER — Other Ambulatory Visit: Payer: Self-pay | Admitting: Internal Medicine

## 2017-07-24 DIAGNOSIS — Z01419 Encounter for gynecological examination (general) (routine) without abnormal findings: Secondary | ICD-10-CM | POA: Diagnosis not present

## 2017-07-24 DIAGNOSIS — M797 Fibromyalgia: Secondary | ICD-10-CM

## 2017-07-25 LAB — HM PAP SMEAR

## 2017-07-25 MED ORDER — NALTREXONE HCL POWD: 5.0000 mg | Freq: Every day | 1 refills | Status: DC

## 2017-08-29 MED FILL — AMPHETAMINE-DEXTROAMPHETAMI: 10 | 30 days supply | Qty: 60 | Fill #0

## 2017-08-29 MED FILL — clonazePAM 1 MG TABS: 1 | 90 days supply | Qty: 90 | Fill #0

## 2017-09-17 MED FILL — ESTRADIOL 0.5 MG TABLET: 0.5 | 90 days supply | Qty: 90 | Fill #0

## 2017-11-04 MED FILL — DEXTROAMP-AMP 10 MG TAB: 10 | 30 days supply | Qty: 60 | Fill #0

## 2017-11-25 ENCOUNTER — Other Ambulatory Visit: Payer: Self-pay

## 2017-11-25 DIAGNOSIS — Z8249 Family history of ischemic heart disease and other diseases of the circulatory system: Secondary | ICD-10-CM

## 2017-11-25 MED FILL — CYCLOBENZAPRINE 10 MG TAB: 10 | 10 days supply | Qty: 30 | Fill #1

## 2017-11-25 MED FILL — clonazePAM 1 MG TABS: 1 | 90 days supply | Qty: 90 | Fill #1

## 2017-11-25 NOTE — Progress Notes (Signed)
Per Dr. Johnsie Cancel okay to order Calcium Score. Order placed. Will send message to scheduling.

## 2017-11-26 ENCOUNTER — Ambulatory Visit (INDEPENDENT_AMBULATORY_CARE_PROVIDER_SITE_OTHER)
Admission: RE | Admit: 2017-11-26 | Discharge: 2017-11-26 | Disposition: A | Discharge status: Home / Self Care | Payer: Self-pay | Source: Ambulatory Visit | Attending: Cardiovascular Disease | Admitting: Cardiovascular Disease

## 2017-11-26 DIAGNOSIS — Z8249 Family history of ischemic heart disease and other diseases of the circulatory system: Secondary | ICD-10-CM

## 2017-12-16 MED FILL — ESTRADIOL 0.5 MG TABLET: 0.5 | 90 days supply | Qty: 90 | Fill #1

## 2018-01-06 MED FILL — DEXTROAMP-AMP 10 MG TAB: 10 | 30 days supply | Qty: 60 | Fill #0

## 2018-02-25 MED FILL — ESTRADIOL 0.5 MG TABLET: 0.5 | 90 days supply | Qty: 90 | Fill #2

## 2018-02-26 ENCOUNTER — Encounter: Payer: Self-pay | Admitting: Internal Medicine

## 2018-02-26 DIAGNOSIS — M797 Fibromyalgia: Secondary | ICD-10-CM

## 2018-02-26 MED FILL — AMPHETAMINE-DEXTROAMPHETAMI: 10 | 30 days supply | Qty: 60 | Fill #0

## 2018-02-27 MED ORDER — NALTREXONE HCL POWD: 5.0000 mg | Freq: Every day | 0 refills | Status: DC

## 2018-02-27 MED FILL — clonazePAM 0.5 MG TABS: 0.5 | 30 days supply | Qty: 60 | Fill #0

## 2018-03-17 ENCOUNTER — Encounter: Payer: Self-pay | Admitting: Internal Medicine

## 2018-03-26 ENCOUNTER — Encounter: Payer: Self-pay | Admitting: Internal Medicine

## 2018-04-02 ENCOUNTER — Ambulatory Visit (INDEPENDENT_AMBULATORY_CARE_PROVIDER_SITE_OTHER): Payer: No Typology Code available for payment source | Admitting: Family Medicine

## 2018-04-02 ENCOUNTER — Encounter: Payer: Self-pay | Admitting: Family Medicine

## 2018-04-02 VITALS — BP 118/64 | HR 66 | Ht 64.0 in | Wt 127.0 lb

## 2018-04-02 DIAGNOSIS — M5412 Radiculopathy, cervical region: Secondary | ICD-10-CM | POA: Diagnosis not present

## 2018-04-02 DIAGNOSIS — S39012A Strain of muscle, fascia and tendon of lower back, initial encounter: Secondary | ICD-10-CM | POA: Diagnosis not present

## 2018-04-02 MED ORDER — NORTRIPTYLINE HCL 25 MG PO CAPS: ORAL_CAPSULE | ORAL | 0 refills | Status: DC

## 2018-04-02 MED FILL — NORTRIPTYLINE HCL 25 MG CAP: 25 | 30 days supply | Qty: 60 | Fill #0

## 2018-04-02 NOTE — Patient Instructions (Signed)
Nice to meet you  We will get you set up for the MRI of the neck  Please try the medicine. If you have any problems with it, please let me know.  Please try to be active as much as you can.

## 2018-04-02 NOTE — Progress Notes (Signed)
Janice Spencer - 52 y.o. female MRN [id number]  Date of birth: 1966-02-26  SUBJECTIVE:  Including CC & ROS.  Chief Complaint  Patient presents with  . Back Pain    Janice Spencer is a 52 y.o. female that is presenting with back and right arm pain and numbness. Pain has been increasing intermittently for the past two months.   Pain is right lateral aspect of her neck and radiates down her arm down the lateral aspect.  Denies any inciting event. Admits to tingling in her arm and middle and ring finger. 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   Low back pain has been intermittent in nature-thinks she feels a knot in the lower right aspect.  She has had this chronically.  She has tried several alternative therapies with no improvement.  She has had pain that is worse at the end of the day.  Denies any radicular symptoms down her legs.  Has tried Cymbalta in the past and this made her pass out.  She has a history of fibromyalgia.  She has been getting massages with no improvement. She has been taking Flexeril for pain. She has been applying ice and heat.  Independent review of the cervical spine xray from 2014 shows no significant degenerative disease.    Review of Systems  Constitutional: Negative for fever.  HENT: Negative for congestion.   Respiratory: Negative for cough.   Cardiovascular: Negative for chest pain.  Gastrointestinal: Negative for abdominal pain.  Musculoskeletal: Positive for back pain and neck pain.  Skin: Negative for color change.  Neurological: Negative for weakness.  Hematological: Negative for adenopathy.  Psychiatric/Behavioral: Negative for agitation.    HISTORY: Past Medical, Surgical, Social, and Family History Reviewed & Updated per EMR.   Pertinent Historical Findings include:  Past Medical History:  Diagnosis Date  . Anxiety   . Depression   . Endometriosis   . IBS (irritable bowel syndrome)     Past Surgical History:  Procedure  Laterality Date  . CHOLECYSTECTOMY    . nasal bone spur removed    . VAGINAL HYSTERECTOMY      Allergies  Allergen Reactions  . Cymbalta [Duloxetine Hcl]     syncope  . Flagyl [Metronidazole]     rash  . Belsomra [Suvorexant] Other (See Comments)    "sleep paralysis"  . Codeine   . Morphine   . Penicillins     Family History  Problem Relation Age of Onset  . Hypertension Mother   . Goiter Mother   . Heart disease Father   . Cancer Neg Hx   . Alcohol abuse Neg Hx   . Stroke Neg Hx      Social History   Socioeconomic History  . Marital status: Divorced    Spouse name: Not on file  . Number of children: Not on file  . Years of education: Not on file  . Highest education level: Not on file  Occupational History  . Not on file  Social Needs  . Financial resource strain: Not on file  . Food insecurity:    Worry: Not on file    Inability: Not on file  . Transportation needs:    Medical: Not on file    Non-medical: Not on file  Tobacco Use  . Smoking status: Former Smoker    Packs/day: 0.25    Last attempt to quit: 05/12/2008    Years since quitting: 9.8  . Smokeless tobacco: Never  Used  Substance and Sexual Activity  . Alcohol use: Yes    Alcohol/week: 2.0 standard drinks    Types: 2 Glasses of wine per week  . Drug use: No  . Sexual activity: Not Currently  Lifestyle  . Physical activity:    Days per week: Not on file    Minutes per session: Not on file  . Stress: Not on file  Relationships  . Social connections:    Talks on phone: Not on file    Gets together: Not on file    Attends religious service: Not on file    Active member of club or organization: Not on file    Attends meetings of clubs or organizations: Not on file    Relationship status: Not on file  . Intimate partner violence:    Fear of current or ex partner: Not on file    Emotionally abused: Not on file    Physically abused: Not on file    Forced sexual activity: Not on file    Other Topics Concern  . Not on file  Social History Narrative  . Not on file     PHYSICAL EXAM:  VS: BP 118/64   Pulse 66   Ht 5\' 4"  (1.626 m)   Wt 127 lb (57.6 kg)   BMI 21.80 kg/m  Physical Exam Gen: NAD, alert, cooperative with exam, well-appearing ENT: normal lips, normal nasal mucosa,  Eye: normal EOM, normal conjunctiva and lids CV:  no edema, +2 pedal pulses   Resp: no accessory muscle use, non-labored,  Skin: no rashes, no areas of induration  Neuro: normal tone, normal sensation to touch Psych:  normal insight, alert and oriented MSK:  Neck: Normal range of motion. Normal strength resistance. Normal strength to resistance with shrug. Normal shoulder range of motion. No signs of atrophy. Normal pincer grasp. Normal grip strength. Back: No tenderness palpation of the lumbar or thoracic midline spine. Tenderness palpation of the right lower lumbar quadrant. Normal flexion and extension. Normal strength resistance with hip flexion. Negative straight leg raise bilaterally. Neurovascular intact     ASSESSMENT & PLAN:   Cervical radiculopathy Symptoms seem to be radicular in nature.  Intermittent and worse when she moves her neck a certain way.  Having some altered sensation down the posterior aspect of her arm. -MRI of cervical spine to evaluate for nerve impingement.  Low back strain Pain seems to be acute on chronic in nature.  Likely related to her posture.  May be a component of fibromyalgia. -Initiate nortriptyline. -Counseled on home exercise therapy. -Given indications to for follow-up.

## 2018-04-02 NOTE — Assessment & Plan Note (Signed)
Symptoms seem to be radicular in nature.  Intermittent and worse when she moves her neck a certain way.  Having some altered sensation down the posterior aspect of her arm. -MRI of cervical spine to evaluate for nerve impingement.

## 2018-04-02 NOTE — Assessment & Plan Note (Signed)
Pain seems to be acute on chronic in nature.  Likely related to her posture.  May be a component of fibromyalgia. -Initiate nortriptyline. -Counseled on home exercise therapy. -Given indications to for follow-up.

## 2018-04-07 ENCOUNTER — Other Ambulatory Visit: Payer: Self-pay | Admitting: Internal Medicine

## 2018-04-07 DIAGNOSIS — M797 Fibromyalgia: Secondary | ICD-10-CM

## 2018-04-07 MED FILL — clonazePAM 0.5 MG TABS: 0.5 | 30 days supply | Qty: 60 | Fill #1

## 2018-04-08 ENCOUNTER — Encounter: Payer: Self-pay | Admitting: Internal Medicine

## 2018-04-08 DIAGNOSIS — M797 Fibromyalgia: Secondary | ICD-10-CM

## 2018-04-10 MED ORDER — NALTREXONE HCL POWD: 5.0000 mg | Freq: Every day | 0 refills | Status: DC

## 2018-04-21 ENCOUNTER — Ambulatory Visit (INDEPENDENT_AMBULATORY_CARE_PROVIDER_SITE_OTHER): Payer: No Typology Code available for payment source | Admitting: Internal Medicine

## 2018-04-21 ENCOUNTER — Encounter: Payer: Self-pay | Admitting: Internal Medicine

## 2018-04-21 ENCOUNTER — Other Ambulatory Visit (INDEPENDENT_AMBULATORY_CARE_PROVIDER_SITE_OTHER): Payer: No Typology Code available for payment source

## 2018-04-21 ENCOUNTER — Ambulatory Visit
Admission: RE | Admit: 2018-04-21 | Discharge: 2018-04-21 | Disposition: A | Discharge status: Home / Self Care | Payer: No Typology Code available for payment source | Source: Ambulatory Visit | Attending: Family Medicine | Admitting: Family Medicine

## 2018-04-21 DIAGNOSIS — Z0000 Encounter for general adult medical examination without abnormal findings: Secondary | ICD-10-CM | POA: Diagnosis not present

## 2018-04-21 DIAGNOSIS — M5412 Radiculopathy, cervical region: Secondary | ICD-10-CM

## 2018-04-21 LAB — BASIC METABOLIC PANEL
BUN: 18 mg/dL (ref 6–23)
CALCIUM: 9.2 mg/dL (ref 8.4–10.5)
CO2: 28 mEq/L (ref 19–32)
CREATININE: 0.78 mg/dL (ref 0.40–1.20)
Chloride: 106 mEq/L (ref 96–112)
GFR: 82.43 mL/min (ref >60.00)
Glucose, Bld: 104 mg/dL — ABNORMAL HIGH (ref 70–99)
Potassium: 4.7 mEq/L (ref 3.5–5.1)
Sodium: 140 mEq/L (ref 135–145)

## 2018-04-21 LAB — LIPID PANEL
CHOL/HDL RATIO: 3
Cholesterol: 214 mg/dL — ABNORMAL HIGH (ref 0–200)
HDL: 72.6 mg/dL (ref >39.00)
LDL Cholesterol: 124 mg/dL — ABNORMAL HIGH (ref 0–99)
NONHDL: 141.29
Triglycerides: 88 mg/dL (ref 0.0–149.0)
VLDL: 17.6 mg/dL (ref 0.0–40.0)

## 2018-04-21 NOTE — Progress Notes (Signed)
Subjective:  Patient ID: Janice Spencer, female    DOB: 1966/02/03  Age: 52 y.o. MRN: [id number]  CC: Annual Exam   HPI Janice Spencer presents for f/up - she feels well and offers no complaints.  Outpatient Medications Prior to Visit  Medication Sig Dispense Refill  . amphetamine-dextroamphetamine (ADDERALL) 10 MG tablet Take 10 mg by mouth daily with breakfast.    . clonazePAM (KLONOPIN) 1 MG tablet Take 1 mg by mouth at bedtime as needed.      Marland Kitchen estrogens, conjugated, (PREMARIN) 1.25 MG tablet Take 1.25 mg by mouth daily.    . Naltrexone HCl POWD Take 5 mg by mouth daily. 4.5 g 0  . nortriptyline (PAMELOR) 25 MG capsule One capsule by mouth each bedtime for a week then 2 capsules at bedtime. 60 capsule 0  . traMADol (ULTRAM) 50 MG tablet TAKE 1 TABLET BY MOUTH EVERY 8 HOURS AS NEEDED 60 tablet 3  . cyclobenzaprine (FLEXERIL) 10 MG tablet TAKE 1/2-1 TABLET BY MOUTH 3 TIMES DAILY AS NEEDED FOR MUSCLE SPASMS. (Patient not taking: Reported on 04/21/2018) 30 tablet 3   No facility-administered medications prior to visit.     ROS Review of Systems  Constitutional: Negative for diaphoresis and fatigue.  HENT: Negative.   Eyes: Negative for visual disturbance.  Respiratory: Negative.  Negative for cough, chest tightness, shortness of breath and wheezing.   Cardiovascular: Negative for chest pain, palpitations and leg swelling.  Gastrointestinal: Negative for abdominal pain, constipation, diarrhea, nausea and vomiting.  Endocrine: Negative.   Genitourinary: Negative.  Negative for difficulty urinating.  Musculoskeletal: Negative.  Negative for arthralgias and myalgias.  Skin: Negative.  Negative for color change.  Neurological: Negative.  Negative for dizziness, weakness and light-headedness.  Hematological: Negative for adenopathy. Does not bruise/bleed easily.  Psychiatric/Behavioral: Negative.  Negative for decreased concentration, dysphoric mood and sleep disturbance. The  patient is not nervous/anxious.     Objective:  BP 100/70 (BP Location: Left Arm, Patient Position: Sitting, Cuff Size: Normal)   Pulse 62   Temp 98.1 F (36.7 C) (Oral)   Resp 16   Ht 5\' 4"  (1.626 m)   Wt 129 lb 8 oz (58.7 kg)   SpO2 99%   BMI 22.23 kg/m   BP Readings from Last 3 Encounters:  04/21/18 100/70  04/02/18 118/64  04/01/17 100/64    Wt Readings from Last 3 Encounters:  04/21/18 129 lb 8 oz (58.7 kg)  04/02/18 127 lb (57.6 kg)  04/01/17 129 lb (58.5 kg)    Physical Exam  Constitutional: She is oriented to person, place, and time. No distress.  HENT:  Mouth/Throat: Oropharynx is clear and moist. No oropharyngeal exudate.  Eyes: Conjunctivae are normal. No scleral icterus.  Neck: Normal range of motion. Neck supple. No JVD present. No thyromegaly present.  Cardiovascular: Normal rate, regular rhythm and normal heart sounds. Exam reveals no gallop.  No murmur heard. Pulmonary/Chest: Effort normal and breath sounds normal. No respiratory distress. She has no wheezes. She has no rales.  Abdominal: Soft. Normal appearance and bowel sounds are normal. She exhibits no mass. There is no hepatosplenomegaly. There is no tenderness.  Musculoskeletal: Normal range of motion. She exhibits no edema, tenderness or deformity.  Lymphadenopathy:    She has no cervical adenopathy.  Neurological: She is alert and oriented to person, place, and time.  Skin: Skin is warm and dry. She is not diaphoretic. No pallor.  Psychiatric: She has a normal mood and affect. Her  behavior is normal. Judgment and thought content normal.  Vitals reviewed.   Lab Results  Component Value Date   WBC 5.1 04/01/2017   HGB 14.0 04/01/2017   HCT 42.0 04/01/2017   PLT 233.0 04/01/2017   GLUCOSE 104 (H) 04/21/2018   CHOL 214 (H) 04/21/2018   TRIG 88.0 04/21/2018   HDL 72.60 04/21/2018   LDLCALC 124 (H) 04/21/2018   ALT 9 04/01/2017   AST 14 04/01/2017   NA 140 04/21/2018   K 4.7 04/21/2018    CL 106 04/21/2018   CREATININE 0.78 04/21/2018   BUN 18 04/21/2018   CO2 28 04/21/2018   TSH 0.84 04/01/2017   HGBA1C 5.6 12/13/2015    Ct Cardiac Scoring  Addendum Date: 11/26/2017   ADDENDUM REPORT: 11/26/2017 18:28 CLINICAL DATA:  Risk stratification EXAM: Coronary Calcium Score TECHNIQUE: The patient was scanned on a [employer] scanner. Axial non-contrast 3 mm slices were carried out through the heart. The data set was analyzed on a dedicated work station and scored using the [place]. FINDINGS: Non-cardiac: See separate report from [facility]. Ascending Aorta: Normal size, no calcifications. Pericardium: Normal. Coronary arteries: Normal origin. IMPRESSION: Coronary calcium score of 0. This was 0 percentile for age and sex matched control. Electronically Signed   By: Ena Dawley   On: 11/26/2017 18:28   Result Date: 11/26/2017 EXAM: OVER-READ INTERPRETATION  CT CHEST The following report is an over-read performed by radiologist Dr. Vinnie Langton of [facility], [place] on 11/26/2017. This over-read does not include interpretation of cardiac or coronary anatomy or pathology. The coronary calcium score/coronary CTA interpretation by the cardiologist is attached. COMPARISON:  None. FINDINGS: Within the visualized portions of the thorax there are no suspicious appearing pulmonary nodules or masses, there is no acute consolidative airspace disease, no pleural effusions, no pneumothorax and no lymphadenopathy. Visualized portions of the upper abdomen are unremarkable. There are no aggressive appearing lytic or blastic lesions noted in the visualized portions of the skeleton. Bilateral breast implants are incidentally noted. IMPRESSION: 1. No significant incidental noncardiac findings are noted. Electronically Signed: By: Vinnie Langton M.D. On: 11/26/2017 09:07    Assessment & Plan:   Janice Spencer was seen today for annual exam.  Diagnoses and all orders for this  visit:  Hypocalcemia- Her calcium level is normal now and she is asymptomatic. -     Basic metabolic panel; Future  Routine general medical examination at a health care facility- Exam completed, labs reviewed, vaccines reviewed, screening for colon cancer is up-to-date/Pap and mammogram are up-to-date, patient education material was given. -     Lipid panel; Future   I am having Leaha Cuervo. Primiano maintain her clonazePAM, amphetamine-dextroamphetamine, traMADol, cyclobenzaprine, estrogens (conjugated), nortriptyline, and Naltrexone HCl.  No orders of the defined types were placed in this encounter.    Follow-up: Return if symptoms worsen or fail to improve.  Scarlette Calico, MD

## 2018-04-21 NOTE — Patient Instructions (Signed)

## 2018-04-22 ENCOUNTER — Telehealth: Payer: Self-pay | Admitting: Family Medicine

## 2018-04-22 NOTE — Telephone Encounter (Signed)
Left VM for patient. If she calls back please have her speak with a nurse/CMA and inform that she has a nerve on the right side getting pinched. We can consider sending her for epidurals if she is still symptomatic. The [facility] can report results to patient.   If any questions then please take the best time and phone number to call and I will try to call her back.   Rosemarie Ax, MD [facility] 04/22/2018, 12:58 PM

## 2018-04-22 NOTE — Telephone Encounter (Signed)
Pt called and telephone message from Clearance Coots read to patient. Pt has questions and would like a call back anytime @ [phone].

## 2018-04-24 ENCOUNTER — Encounter: Payer: Self-pay | Admitting: Family Medicine

## 2018-04-27 NOTE — Telephone Encounter (Signed)
Left VM for patient.  If any questions then please take the best time and phone number to call and I will try to call her back.   Rosemarie Ax, MD [facility] 04/27/2018, 1:53 PM

## 2018-04-30 MED FILL — AMPHETAMINE-DEXTROAMPHETAMI: 10 | 30 days supply | Qty: 60 | Fill #0

## 2018-05-01 ENCOUNTER — Encounter: Payer: Self-pay | Admitting: Internal Medicine

## 2018-05-05 ENCOUNTER — Encounter: Payer: Self-pay | Admitting: Family Medicine

## 2018-05-05 DIAGNOSIS — M5412 Radiculopathy, cervical region: Secondary | ICD-10-CM

## 2018-05-05 MED FILL — clonazePAM 0.5 MG TABS: 0.5 | 30 days supply | Qty: 60 | Fill #2

## 2018-05-07 ENCOUNTER — Other Ambulatory Visit: Payer: Self-pay | Admitting: Family Medicine

## 2018-05-07 DIAGNOSIS — M5412 Radiculopathy, cervical region: Secondary | ICD-10-CM

## 2018-05-19 ENCOUNTER — Other Ambulatory Visit: Payer: Self-pay | Admitting: Family Medicine

## 2018-05-19 DIAGNOSIS — S39012A Strain of muscle, fascia and tendon of lower back, initial encounter: Secondary | ICD-10-CM

## 2018-05-19 MED FILL — NORTRIPTYLINE HCL 25 MG CAP: 25 | 30 days supply | Qty: 60 | Fill #0

## 2018-05-26 MED FILL — ESTRADIOL 0.5 MG TABLET: 0.5 | 90 days supply | Qty: 90 | Fill #3

## 2018-05-29 ENCOUNTER — Other Ambulatory Visit: Payer: Self-pay

## 2018-05-29 ENCOUNTER — Ambulatory Visit: Payer: No Typology Code available for payment source | Attending: Family Medicine | Admitting: Physical Therapy

## 2018-05-29 DIAGNOSIS — M542 Cervicalgia: Secondary | ICD-10-CM | POA: Diagnosis not present

## 2018-05-29 DIAGNOSIS — M62838 Other muscle spasm: Secondary | ICD-10-CM | POA: Diagnosis present

## 2018-05-29 NOTE — Therapy (Signed)
[address] [place], [place], [address] Phone: [phone]   Fax:  [phone]  Physical Therapy Evaluation  Patient Details  Name: Janice Spencer MRN: [id number] Date of Birth: 1965/12/20 Referring Provider (PT): Clearance Coots   Encounter Date: 05/29/2018  PT End of Session - 05/29/18 0915    Visit Number  1    Date for PT Re-Evaluation  07/29/18    Authorization Type  [facility]    PT Start Time  0845    PT Stop Time  0930    PT Time Calculation (min)  45 min    Activity Tolerance  Patient tolerated treatment well    Behavior During Therapy  [facility] for tasks assessed/performed       Past Medical History:  Diagnosis Date  . Anxiety   . Depression   . Endometriosis   . IBS (irritable bowel syndrome)     Past Surgical History:  Procedure Laterality Date  . CHOLECYSTECTOMY    . nasal bone spur removed    . VAGINAL HYSTERECTOMY      There were no vitals filed for this visit.   Subjective Assessment - 05/29/18 0845    Subjective  Patient has had pain in the neck for many years, reports that right arm pain and numbness is new over the past year.  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      Limitations  Sitting    Patient Stated Goals  have less pain    Currently in Pain?  Yes    Pain Score  2     Pain Location  Neck    Pain Orientation  Right    Pain Descriptors / Indicators  Tightness;Aching;Tingling    Pain Type  Acute pain    Pain Radiating Towards  right arm pain from elbow to 3rd 4tha nd 5th fingers    Pain Onset  More than a month ago    Pain Frequency  Constant    Aggravating Factors   computer, sleep, up to 5/10    Pain Relieving Factors  pain medication has helped pain can be 1/10    Effect of Pain on Daily Activities  just really wears on me         [facility] PT Assessment - 05/29/18 0001      Assessment   Medical Diagnosis  cervical radiculoapthy     Referring Provider (PT)   Clearance Coots    Onset Date/Surgical Date  04/28/18    Hand Dominance  Right    Prior Therapy  no      Precautions   Precautions  None      Balance Screen   Has the patient fallen in the past 6 months  No    Has the patient had a decrease in activity level because of a fear of falling?   No    Is the patient reluctant to leave their home because of a fear of falling?   No      Home Environment   Additional Comments  does housework,       Prior Function   Level of Independence  Independent    Vocation  Full time employment    Vocation Requirements  cardiology clinic nurse, mostly sitting at the computer      Posture/Postural Control   Posture Comments  mild fwd head, rounded shoulders, significant winged sapulae      ROM / Strength   AROM /  PROM / Strength  AROM;Strength      AROM   Overall AROM Comments  cervical ROM decreased 25% except left rotation and left side bending limited 50% with tightness and pain      Strength   Overall Strength Comments  Shoulders 4/5, Grip strength 70# bilaterally      Palpation   Palpation comment  very tight and tender in the upper trap and the neck area as well as the rhomboids, hsa a large trigger point in the right upper trap, did replicate some right HA type pain                Objective measurements completed on examination: See above findings.      [facility] Adult PT Treatment/Exercise - 05/29/18 0001      Modalities   Modalities  Electrical Stimulation;Moist Heat      Moist Heat Therapy   Number Minutes Moist Heat  15 Minutes    Moist Heat Location  Cervical      Electrical Stimulation   Electrical Stimulation Location  right cervical and upper trap area    Electrical Stimulation Action  IFC    Electrical Stimulation Parameters  supine    Electrical Stimulation Goals  Pain             PT Education - 05/29/18 0915    Education Details  red tband scapular stabilization    Person(s) Educated  Patient     Methods  Explanation;Demonstration;Handout    Comprehension  Verbalized understanding       PT Short Term Goals - 05/29/18 0919      PT SHORT TERM GOAL #1   Title  independent with initial HEP    Time  2    Period  Weeks    Status  New        PT Long Term Goals - 05/29/18 0920      PT LONG TERM GOAL #1   Title  understand posture and body mechanics    Time  8    Period  Weeks    Status  New      PT LONG TERM GOAL #2   Title  decrease pain 25%    Time  8    Period  Weeks    Status  New      PT LONG TERM GOAL #3   Title  increase cervical ROM 25%    Time  8    Period  Weeks    Status  New      PT LONG TERM GOAL #4   Title  report 50% less radicular symptoms    Time  8    Period  Weeks    Status  New             Plan - 05/29/18 0916    Clinical Impression Statement  Patient reports years of neck pain, reports over the past year some increased radicular symptoms in the right UE.  MRI showed stenosis.  She has significant scapular winging, she is very tight with a large trigger point in the right upper trap, she has some decreased ROM.  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    History and Personal Factors relevant to plan of care:  fibromyalgia, arthritis    Clinical Presentation  Stable    Clinical Decision Making  Low  Rehab Potential  Good    PT Frequency  2x / week    PT Duration  8 weeks    PT Treatment/Interventions  ADLs/Self Care Home Management;Electrical Stimulation;Moist Heat;Traction;Ultrasound;Therapeutic exercise;Therapeutic activities;Patient/family education;Manual techniques;Dry needling    PT Next Visit Plan  Try traction, work on scapular stability, could try dry needling    Consulted and Agree with Plan of Care  Patient       Patient will benefit from skilled therapeutic intervention in order to improve the following deficits and  impairments:  Decreased range of motion, Increased fascial restricitons, Increased muscle spasms, Pain, Improper body mechanics, Impaired flexibility, Decreased strength, Postural dysfunction  Visit Diagnosis: Cervicalgia - Plan: PT plan of care cert/re-cert  Other muscle spasm - Plan: PT plan of care cert/re-cert     Problem List Patient Active Problem List   Diagnosis Date Noted  . Cervical radiculopathy 04/02/2018  . Hypocalcemia 12/20/2015  . 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 Low back strain 03/24/2015  . Routine general medical examination at a health care facility 07/18/2014  . Fibromyalgia muscle pain 10/18/2011  . Regional enteritis [facility]) 09/13/2004    Sumner Boast., PT 05/29/2018, 9:23 AM  [address] [place], [place], [address] Phone: [phone]   Fax:  [phone]  Name: Janice Spencer MRN: [id number] Date of Birth: [birth year]

## 2018-06-01 MED FILL — clonazePAM 0.5 MG TABS: 0.5 | 30 days supply | Qty: 60 | Fill #3

## 2018-06-11 ENCOUNTER — Ambulatory Visit: Payer: No Typology Code available for payment source | Attending: Family Medicine | Admitting: Physical Therapy

## 2018-06-11 DIAGNOSIS — M542 Cervicalgia: Secondary | ICD-10-CM | POA: Insufficient documentation

## 2018-06-11 DIAGNOSIS — M62838 Other muscle spasm: Secondary | ICD-10-CM | POA: Diagnosis present

## 2018-06-11 NOTE — Therapy (Signed)
[address] [place], [place], [address] Phone: [phone]   Fax:  [phone]  Physical Therapy Treatment  Patient Details  Name: Janice Spencer MRN: [id number] Date of Birth: [birth year] Referring Provider (PT): Clearance Coots   Encounter Date: 06/11/2018  PT End of Session - 06/11/18 1102    Visit Number  2    Date for PT Re-Evaluation  07/29/18    Authorization Type  [facility]    PT Start Time  1103    PT Stop Time  1156    PT Time Calculation (min)  53 min    Activity Tolerance  Patient tolerated treatment well    Behavior During Therapy  [facility] for tasks assessed/performed       Past Medical History:  Diagnosis Date  . Anxiety   . Depression   . Endometriosis   . IBS (irritable bowel syndrome)     Past Surgical History:  Procedure Laterality Date  . CHOLECYSTECTOMY    . nasal bone spur removed    . VAGINAL HYSTERECTOMY      There were no vitals filed for this visit.  Subjective Assessment - 06/11/18 1104    Subjective  Patient reports she was feeling pretty good until she was walking a dog this morning and it lunged after a deer and yanked her arm.    Patient Stated Goals  have less pain    Currently in Pain?  Yes    Pain Score  2     Pain Location  Neck    Pain Orientation  Right    Pain Descriptors / Indicators  Tightness;Aching;Tingling    Pain Type  Acute pain                       [facility] Adult PT Treatment/Exercise - 06/11/18 0001      Exercises   Exercises  Neck      Neck Exercises: Machines for Strengthening   UBE (Upper Arm Bike)  L2 x 6 min 13fd/3bwd      Neck Exercises: Theraband   Scapula Retraction  20 reps;Red;10 reps    Shoulder Extension  20 reps;10 reps;Red    Horizontal ABduction  20 reps;Red      Neck Exercises: Supine   Neck Retraction  10 reps    Other Supine Exercise  ulnar nerve tension/release x 5      Modalities   Modalities   Traction      Traction   Type of Traction  Cervical    Max (lbs)  11    Time  10 min      Manual Therapy   Manual Therapy  Soft tissue mobilization;Joint mobilization;Manual Traction    Joint Mobilization  mid and upper T spine PA    Soft tissue mobilization  to bil UT, levator scap and paraspinals C/T    Manual Traction  2x 30 seconds in supine       Trigger Point Dry Needling - 06/11/18 1152    Consent Given?  Yes    Education Handout Provided  Yes    Muscles Treated Upper Body  Upper trapezius;Suboccipitals muscle group;Levator scapulae;Longissimus    Upper Trapezius Response  Twitch reponse elicited;Palpable increased muscle length   bil   SubOccipitals Response  Palpable increased muscle length   Right   Levator Scapulae Response  Twitch response elicited;Palpable increased muscle length   Bil   Longissimus Response  Twitch response elicited;Palpable increased muscle length   Bil multifidi C2-5            PT Short Term Goals - 05/29/18 0919      PT SHORT TERM GOAL #1   Title  independent with initial HEP    Time  2    Period  Weeks    Status  New        PT Long Term Goals - 05/29/18 0920      PT LONG TERM GOAL #1   Title  understand posture and body mechanics    Time  8    Period  Weeks    Status  New      PT LONG TERM GOAL #2   Title  decrease pain 25%    Time  8    Period  Weeks    Status  New      PT LONG TERM GOAL #3   Title  increase cervical ROM 25%    Time  8    Period  Weeks    Status  New      PT LONG TERM GOAL #4   Title  report 50% less radicular symptoms    Time  8    Period  Weeks    Status  New            Plan - 06/11/18 1154    Clinical Impression Statement  Patient tolerated initial TE and manual therapy well. She gave consent to DN and had very good responses in UT and multifidi. Continues to c/o of nunmbnes in right digits 3 and 4. No goals met as only second visit.    Rehab Potential  Good    PT Frequency  2x /  week    PT Duration  8 weeks    PT Treatment/Interventions  ADLs/Self Care Home Management;Electrical Stimulation;Moist Heat;Traction;Ultrasound;Therapeutic exercise;Therapeutic activities;Patient/family education;Manual techniques;Dry needling    PT Next Visit Plan  Assess response to traction/DN;  work on scapular stability    Consulted and Agree with Plan of Care  Patient       Patient will benefit from skilled therapeutic intervention in order to improve the following deficits and impairments:  Decreased range of motion, Increased fascial restricitons, Increased muscle spasms, Pain, Improper body mechanics, Impaired flexibility, Decreased strength, Postural dysfunction  Visit Diagnosis: Cervicalgia  Other muscle spasm     Problem List Patient Active Problem List   Diagnosis Date Noted  . Cervical radiculopathy 04/02/2018  . Hypocalcemia 12/20/2015  . 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 Low back strain 03/24/2015  . Routine general medical examination at a health care facility 07/18/2014  . Fibromyalgia muscle pain 10/18/2011  . Regional enteritis [facility]) 09/13/2004    Madelyn Flavors PT 06/11/2018, 12:01 PM  [address] [place], [place], [address] Phone: [phone]   Fax:  [phone]  Name: Janice Spencer MRN: [id number] Date of Birth: [birth year]

## 2018-06-11 NOTE — Patient Instructions (Signed)
Trigger Point Dry Needling  . What is Trigger Point Dry Needling (DN)? o DN is a physical therapy technique used to treat muscle pain and dysfunction. Specifically, DN helps deactivate muscle trigger points (muscle knots).  o A thin filiform needle is used to penetrate the skin and stimulate the underlying trigger point. The goal is for a local twitch response (LTR) to occur and for the trigger point to relax. No medication of any kind is injected during the procedure.   . What Does Trigger Point Dry Needling Feel Like?  o The procedure feels different for each individual patient. Some patients report that they do not actually feel the needle enter the skin and overall the process is not painful. Very mild bleeding may occur. However, many patients feel a deep cramping in the muscle in which the needle was inserted. This is the local twitch response.   Marland Kitchen How Will I feel after the treatment? o Soreness is normal, and the onset of soreness may not occur for a few hours. Typically this soreness does not last longer than two days.  o Bruising is uncommon, however; ice can be used to decrease any possible bruising.  o In rare cases feeling tired or nauseous after the treatment is normal. In addition, your symptoms may get worse before they get better, this period will typically not last longer than 24 hours.   . What Can I do After My Treatment? o Increase your hydration by drinking more water for the next 24 hours. o You may place ice or heat on the areas treated that have become sore, however, do not use heat on inflamed or bruised areas. Heat often brings more relief post needling. o You can continue your regular activities, but vigorous activity is not recommended initially after the treatment for 24 hours. o DN is best combined with other physical therapy such as strengthening, stretching, and other therapies.     Madelyn Flavors, PT 06/11/18 11:41 AM [address] [place], [place], [address] Phone: [phone]   Fax:  [phone]

## 2018-06-15 ENCOUNTER — Ambulatory Visit: Payer: No Typology Code available for payment source | Admitting: Physical Therapy

## 2018-06-15 DIAGNOSIS — M542 Cervicalgia: Secondary | ICD-10-CM | POA: Diagnosis not present

## 2018-06-15 DIAGNOSIS — M62838 Other muscle spasm: Secondary | ICD-10-CM

## 2018-06-15 NOTE — Therapy (Signed)
During this treatment session, the therapist was present, participating in and directing the treatment. [address] [place], [place], [address] Phone: [phone]   Fax:  [phone]  Physical Therapy Treatment  Patient Details  Name: Janice Spencer MRN: [id number] Date of Birth: Jun 24, 1966 Referring Provider (PT): Clearance Coots   Encounter Date: 06/15/2018  PT End of Session - 06/15/18 1650    Visit Number  3    Date for PT Re-Evaluation  07/29/18    Authorization Type  [facility]    PT Start Time  1600    PT Stop Time  1650    PT Time Calculation (min)  50 min    Activity Tolerance  Patient tolerated treatment well    Behavior During Therapy  [facility] for tasks assessed/performed       Past Medical History:  Diagnosis Date  . Anxiety   . Depression   . Endometriosis   . IBS (irritable bowel syndrome)     Past Surgical History:  Procedure Laterality Date  . CHOLECYSTECTOMY    . nasal bone spur removed    . VAGINAL HYSTERECTOMY      There were no vitals filed for this visit.  Subjective Assessment - 06/15/18 1601    Subjective  "Im okay I cant really feel any changes yet"    Currently in Pain?  No/denies                       [facility] Adult PT Treatment/Exercise - 06/15/18 0001      Exercises   Exercises  Neck      Neck Exercises: Machines for Strengthening   UBE (Upper Arm Bike)  L2 x 6 min 68fwd/3bwd      Neck Exercises: Standing   Neck Retraction  15 reps;3 secs   ball on wall, 2x15   Other Standing Exercises  Rows, shoulder ext, horizontal abd (red Tband) 2x15 each    Other Standing Exercises  Shrugs 2x10 5# dumbells      Manual Therapy   Manual Therapy  Soft tissue mobilization;Manual Traction    Joint Mobilization  mid and upper T spine PA    Soft tissue mobilization  to bil UT, levator scap and paraspinals     Manual Traction  2x 30 seconds in supine                PT Short Term Goals - 06/15/18 1653      PT SHORT TERM GOAL #1   Title  independent with initial HEP    Time  2    Period  Weeks    Status  New        PT Long Term Goals - 05/29/18 0920      PT LONG TERM GOAL #1   Title  understand posture and body mechanics    Time  8    Period  Weeks    Status  New      PT LONG TERM GOAL #2   Title  decrease pain 25%    Time  8    Period  Weeks    Status  New      PT LONG TERM GOAL #3   Title  increase cervical ROM 25%    Time  8    Period  Weeks    Status  New      PT LONG TERM GOAL #4  Title  report 50% less radicular symptoms    Time  8    Period  Weeks    Status  New            Plan - 06/15/18 1651    Clinical Impression Statement  Pt presented with tightness in bilateral UT and paraspinals. Pt tolerated progression of TE well without increased pain. Pt demonstrated good body machanics with all exercises. Pt continues to progress appropriatly towards all therapy goals at this time.     Rehab Potential  Good    PT Frequency  2x / week    PT Duration  8 weeks    PT Treatment/Interventions  ADLs/Self Care Home Management;Electrical Stimulation;Moist Heat;Traction;Ultrasound;Therapeutic exercise;Therapeutic activities;Patient/family education;Manual techniques;Dry needling    PT Next Visit Plan  Continue stretching and progressing TE. Ask about DN       Patient will benefit from skilled therapeutic intervention in order to improve the following deficits and impairments:  Decreased range of motion, Increased fascial restricitons, Increased muscle spasms, Pain, Improper body mechanics, Impaired flexibility, Decreased strength, Postural dysfunction  Visit Diagnosis: Cervicalgia  Other muscle spasm     Problem List Patient Active Problem List   Diagnosis Date Noted  . Cervical radiculopathy 04/02/2018  . Hypocalcemia 12/20/2015  . 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 Low back strain  03/24/2015  . Routine general medical examination at a health care facility 07/18/2014  . Fibromyalgia muscle pain 10/18/2011  . Regional enteritis [facility]) 09/13/2004    Howell Rucks, SPTA 06/15/2018, 4:54 PM  [address] [place], [place], [address] Phone: [phone]   Fax:  [phone]  Name: KIYANI JERNIGAN MRN: [id number] Date of Birth: 05-03-1966

## 2018-06-18 ENCOUNTER — Encounter: Payer: Self-pay | Admitting: Physical Therapy

## 2018-06-18 ENCOUNTER — Ambulatory Visit: Payer: No Typology Code available for payment source | Admitting: Physical Therapy

## 2018-06-18 DIAGNOSIS — M62838 Other muscle spasm: Secondary | ICD-10-CM

## 2018-06-18 DIAGNOSIS — M542 Cervicalgia: Secondary | ICD-10-CM

## 2018-06-18 NOTE — Therapy (Signed)
[address] [place], [place], [address] Phone: [phone]   Fax:  [phone]  Physical Therapy Treatment  Patient Details  Name: Janice Spencer MRN: [id number] Date of Birth: 04-30-1966 Referring Provider (PT): Clearance Coots   Encounter Date: 06/18/2018  PT End of Session - 06/18/18 0929    Visit Number  4    Date for PT Re-Evaluation  07/29/18    Authorization Type  [facility]    PT Start Time  0845    PT Stop Time  0925    PT Time Calculation (min)  40 min    Activity Tolerance  Patient tolerated treatment well    Behavior During Therapy  [facility] for tasks assessed/performed       Past Medical History:  Diagnosis Date  . Anxiety   . Depression   . Endometriosis   . IBS (irritable bowel syndrome)     Past Surgical History:  Procedure Laterality Date  . CHOLECYSTECTOMY    . nasal bone spur removed    . VAGINAL HYSTERECTOMY      There were no vitals filed for this visit.  Subjective Assessment - 06/18/18 0846    Subjective  Pt reports no pain only tightness on the R side of her neck and beside her R shoulder blade.    Currently in Pain?  No/denies                       [facility] Adult PT Treatment/Exercise - 06/18/18 0001      Neck Exercises: Machines for Strengthening   UBE (Upper Arm Bike)  L2 x 6 min 87fwd/3bwd    Other Machines for Strengthening  Rows & lats 20lb 2x10       Neck Exercises: Theraband   Shoulder Extension  20 reps;Red    Rows  15 reps;Red    Shoulder External Rotation  20 reps;Red      Manual Therapy   Manual Therapy  Soft tissue mobilization;Manual Traction;Passive ROM    Manual therapy comments  Tightness near end range of L rotation    Soft tissue mobilization  to bil UT, levator scap and paraspinals     Passive ROM  Cervical spine all directions    Manual Traction  3x 30 seconds in supine               PT Short Term Goals - 06/15/18  1653      PT SHORT TERM GOAL #1   Title  independent with initial HEP    Time  2    Period  Weeks    Status  New        PT Long Term Goals - 05/29/18 0920      PT LONG TERM GOAL #1   Title  understand posture and body mechanics    Time  8    Period  Weeks    Status  New      PT LONG TERM GOAL #2   Title  decrease pain 25%    Time  8    Period  Weeks    Status  New      PT LONG TERM GOAL #3   Title  increase cervical ROM 25%    Time  8    Period  Weeks    Status  New      PT LONG TERM GOAL #4   Title  report 50% less  radicular symptoms    Time  8    Period  Weeks    Status  New            Plan - 06/18/18 0929    Clinical Impression Statement  Overall pot did well with all of today's exercises. She reports no pain only tightness on the R side of her neck. Reports some shoulder popping with standing rows using red Tband. Pt maintain good posture with all exercises. Positive response to MT, increase tightness noted with L cervical passive rotation. Reports a pulling in the anterior R shoulder with cervical retraction with neutral head.     Rehab Potential  Good    PT Frequency  2x / week    PT Duration  8 weeks    PT Treatment/Interventions  ADLs/Self Care Home Management;Electrical Stimulation;Moist Heat;Traction;Ultrasound;Therapeutic exercise;Therapeutic activities;Patient/family education;Manual techniques;Dry needling    PT Next Visit Plan  Continue stretching and progressing TE. Ask about DN       Patient will benefit from skilled therapeutic intervention in order to improve the following deficits and impairments:  Decreased range of motion, Increased fascial restricitons, Increased muscle spasms, Pain, Improper body mechanics, Impaired flexibility, Decreased strength, Postural dysfunction  Visit Diagnosis: Cervicalgia  Other muscle spasm     Problem List Patient Active Problem List   Diagnosis Date Noted  . Cervical radiculopathy 04/02/2018  .  Hypocalcemia 12/20/2015  . 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 Low back strain 03/24/2015  . Routine general medical examination at a health care facility 07/18/2014  . Fibromyalgia muscle pain 10/18/2011  . Regional enteritis [facility]) 09/13/2004    Scot Jun, PTA 06/18/2018, 9:31 AM  [address] [place], [place], [address] Phone: [phone]   Fax:  [phone]  Name: Janice Spencer MRN: [id number] Date of Birth: Jul 29, 1965

## 2018-06-23 ENCOUNTER — Ambulatory Visit: Payer: No Typology Code available for payment source | Admitting: Physical Therapy

## 2018-06-23 ENCOUNTER — Encounter: Payer: Self-pay | Admitting: Physical Therapy

## 2018-06-23 DIAGNOSIS — M542 Cervicalgia: Secondary | ICD-10-CM | POA: Diagnosis not present

## 2018-06-23 DIAGNOSIS — M62838 Other muscle spasm: Secondary | ICD-10-CM

## 2018-06-23 NOTE — Therapy (Signed)
[address] [place], [place], [address] Phone: [phone]   Fax:  [phone]  Physical Therapy Treatment  Patient Details  Name: Janice Spencer MRN: [id number] Date of Birth: January 06, 1966 Referring Provider (PT): Clearance Coots   Encounter Date: 06/23/2018  PT End of Session - 06/23/18 1646    Visit Number  5    Date for PT Re-Evaluation  07/29/18    Authorization Type  [facility]    PT Start Time  1600    PT Stop Time  1645    PT Time Calculation (min)  45 min    Activity Tolerance  Patient tolerated treatment well    Behavior During Therapy  [facility] for tasks assessed/performed       Past Medical History:  Diagnosis Date  . Anxiety   . Depression   . Endometriosis   . IBS (irritable bowel syndrome)     Past Surgical History:  Procedure Laterality Date  . CHOLECYSTECTOMY    . nasal bone spur removed    . VAGINAL HYSTERECTOMY      There were no vitals filed for this visit.  Subjective Assessment - 06/23/18 1602    Subjective  "Tired, just got off the plane"     Currently in Pain?  No/denies    Pain Score  0-No pain    Pain Location  Neck    Pain Descriptors / Indicators  Tightness         [facility] PT Assessment - 06/23/18 0001      AROM   Overall AROM Comments  cervica ROM WFL                   [facility] Adult PT Treatment/Exercise - 06/23/18 0001      Neck Exercises: Machines for Strengthening   UBE (Upper Arm Bike)  L2 x 6 min 37fd/3bwd      Neck Exercises: Theraband   Rows  15 reps;Green    Horizontal ABduction  20 reps;Red      Neck Exercises: Standing   Wall Push Ups  20 reps    Other Standing Exercises  ER red 2x10,  shrugs levater stretch x5     Other Standing Exercises  Shoulder ext 5lb x10, x5       Manual Therapy   Manual Therapy  Soft tissue mobilization;Manual Traction;Passive ROM    Manual therapy comments  Tightness near end range of L rotation    Joint Mobilization  mid and upper T spine PA    Soft tissue mobilization  to bil UT, levator scap and paraspinals     Passive ROM  Cervical spine all directions    Manual Traction  3x 30 seconds in supine               PT Short Term Goals - 06/15/18 1653      PT SHORT TERM GOAL #1   Title  independent with initial HEP    Time  2    Period  Weeks    Status  New        PT Long Term Goals - 06/23/18 1645      PT LONG TERM GOAL #1   Title  understand posture and body mechanics    Status  Achieved      PT LONG TERM GOAL #2   Title  decrease pain 25%    Status  Partially Met      PT  LONG TERM GOAL #3   Title  increase cervical ROM 25%    Status  Achieved      PT LONG TERM GOAL #4   Title  report 50% less radicular symptoms    Status  Partially Met            Plan - 06/23/18 1648    Clinical Impression Statement  Pt is doing well progressing towards goals. All exercises completed with good form and strength. she reports some cervical rotation to R side. Noticeable trigger point noted in upper R trap with MT. PT has full cervical PROM.    Rehab Potential  Good    PT Frequency  2x / week    PT Duration  8 weeks    PT Treatment/Interventions  ADLs/Self Care Home Management;Electrical Stimulation;Moist Heat;Traction;Ultrasound;Therapeutic exercise;Therapeutic activities;Patient/family education;Manual techniques;Dry needling    PT Next Visit Plan  Continue stretching and progressing TE.        Patient will benefit from skilled therapeutic intervention in order to improve the following deficits and impairments:  Decreased range of motion, Increased fascial restricitons, Increased muscle spasms, Pain, Improper body mechanics, Impaired flexibility, Decreased strength, Postural dysfunction  Visit Diagnosis: Other muscle spasm  Cervicalgia     Problem List Patient Active Problem List   Diagnosis Date Noted  . Cervical radiculopathy 04/02/2018  . Hypocalcemia  12/20/2015  . 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 Low back strain 03/24/2015  . Routine general medical examination at a health care facility 07/18/2014  . Fibromyalgia muscle pain 10/18/2011  . Regional enteritis [facility]) 09/13/2004    Scot Jun, PTA 06/23/2018, 4:52 PM  [address] [place], [place], [address] Phone: [phone]   Fax:  [phone]  Name: LAURA-LEE VILLEGAS MRN: [id number] Date of Birth: 1965-07-16

## 2018-06-26 MED FILL — AMPHETAMINE-DEXTROAMPHETAMI: 10 | 30 days supply | Qty: 60 | Fill #0

## 2018-06-29 MED FILL — clonazePAM 1 MG TABS: 1 | 90 days supply | Qty: 90 | Fill #0

## 2018-06-30 ENCOUNTER — Encounter: Payer: Self-pay | Admitting: Physical Therapy

## 2018-06-30 ENCOUNTER — Ambulatory Visit: Payer: No Typology Code available for payment source | Admitting: Physical Therapy

## 2018-06-30 DIAGNOSIS — M542 Cervicalgia: Secondary | ICD-10-CM | POA: Diagnosis not present

## 2018-06-30 DIAGNOSIS — M62838 Other muscle spasm: Secondary | ICD-10-CM

## 2018-06-30 NOTE — Therapy (Signed)
[address] [place], [place], [address] Phone: [phone]   Fax:  [phone]  Physical Therapy Treatment  Patient Details  Name: Janice Spencer MRN: [id number] Date of Birth: [birth year] Referring Provider (PT): Clearance Coots   Encounter Date: 06/30/2018  PT End of Session - 06/30/18 1337    Visit Number  6    Date for PT Re-Evaluation  07/29/18    Authorization Type  [facility]    PT Start Time  1300    PT Stop Time  1354    PT Time Calculation (min)  54 min    Activity Tolerance  Patient tolerated treatment well    Behavior During Therapy  [facility] for tasks assessed/performed       Past Medical History:  Diagnosis Date  . Anxiety   . Depression   . Endometriosis   . IBS (irritable bowel syndrome)     Past Surgical History:  Procedure Laterality Date  . CHOLECYSTECTOMY    . nasal bone spur removed    . VAGINAL HYSTERECTOMY      There were no vitals filed for this visit.  Subjective Assessment - 06/30/18 1258    Subjective  Pt reports waking up itching around her eyes. Reports feeling tightness around her R scapula.    Currently in Pain?  No/denies                       [facility] Adult PT Treatment/Exercise - 06/30/18 0001      Neck Exercises: Machines for Strengthening   UBE (Upper Arm Bike)  L2 x 6 min 77fd/3bwd      Neck Exercises: Theraband   Shoulder External Rotation  20 reps   yellw    Horizontal ABduction  20 reps   yellow    Other Theraband Exercises  D2 flex x10 each       Neck Exercises: Standing   Wall Push Ups  20 reps      Manual Therapy   Manual Therapy  Soft tissue mobilization;Manual Traction;Passive ROM    Soft tissue mobilization  to bil UT, levator scap and paraspinals     Passive ROM  Cervical spine all directions    Manual Traction  3x 30 seconds in supine               PT Short Term Goals - 06/15/18 1653      PT SHORT TERM GOAL  #1   Title  independent with initial HEP    Time  2    Period  Weeks    Status  New        PT Long Term Goals - 06/23/18 1645      PT LONG TERM GOAL #1   Title  understand posture and body mechanics    Status  Achieved      PT LONG TERM GOAL #2   Title  decrease pain 25%    Status  Partially Met      PT LONG TERM GOAL #3   Title  increase cervical ROM 25%    Status  Achieved      PT LONG TERM GOAL #4   Title  report 50% less radicular symptoms    Status  Partially Met            Plan - 06/30/18 1340    Clinical Impression Statement  Pt enters clinic reporting that's she does not feel  well. Stated she woke up with itchy eyes and increase tightness around her R scapula. Trigger point noted in R rhomboids. Light resisted exercises completed today with no adverse effects. Positive response to MT.     PT Frequency  2x / week    PT Duration  8 weeks    PT Treatment/Interventions  ADLs/Self Care Home Management;Electrical Stimulation;Moist Heat;Traction;Ultrasound;Therapeutic exercise;Therapeutic activities;Patient/family education;Manual techniques;Dry needling    PT Next Visit Plan  Continue stretching and progressing TE.        Patient will benefit from skilled therapeutic intervention in order to improve the following deficits and impairments:  Decreased range of motion, Increased fascial restricitons, Increased muscle spasms, Pain, Improper body mechanics, Impaired flexibility, Decreased strength, Postural dysfunction  Visit Diagnosis: Other muscle spasm  Cervicalgia     Problem List Patient Active Problem List   Diagnosis Date Noted  . Cervical radiculopathy 04/02/2018  . Hypocalcemia 12/20/2015  . 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 Low back strain 03/24/2015  . Routine general medical examination at a health care facility 07/18/2014  . Fibromyalgia muscle pain 10/18/2011  . Regional enteritis [facility]) 09/13/2004    Scot Jun,  PTA 06/30/2018, 1:42 PM  [address] [place], [place], [address] Phone: [phone]   Fax:  [phone]  Name: Janice Spencer MRN: [id number] Date of Birth: 1965-11-04

## 2018-07-14 ENCOUNTER — Other Ambulatory Visit: Payer: Self-pay | Admitting: Family Medicine

## 2018-07-14 DIAGNOSIS — S39012A Strain of muscle, fascia and tendon of lower back, initial encounter: Secondary | ICD-10-CM

## 2018-07-14 MED FILL — NORTRIPTYLINE HCL 25 MG CAP: 25 | 30 days supply | Qty: 60 | Fill #0

## 2018-07-16 ENCOUNTER — Encounter: Payer: Self-pay | Admitting: Internal Medicine

## 2018-07-16 NOTE — Telephone Encounter (Signed)
rf rq for naltrexone.

## 2018-07-18 ENCOUNTER — Other Ambulatory Visit: Payer: Self-pay | Admitting: Internal Medicine

## 2018-07-18 DIAGNOSIS — M797 Fibromyalgia: Secondary | ICD-10-CM

## 2018-07-18 MED ORDER — NALTREXONE HCL POWD: 5.0000 mg | Freq: Every day | 1 refills | Status: DC

## 2018-07-22 LAB — HM MAMMOGRAPHY: HM Mammogram: NORMAL (ref 0–4)

## 2018-07-23 ENCOUNTER — Telehealth: Payer: Self-pay | Admitting: Internal Medicine

## 2018-07-23 NOTE — Telephone Encounter (Signed)
Copied from [id number] [phone]. Topic: Quick Communication - Rx Refill/Question >> Jul 23, 2018  1:09 PM Scherrie Gerlach wrote: Medication: Naltrexone HCl POWD Pt had sent a mychart message request for this med [facility] called to advise they do not do any compound meds.  [facility] [place], [place] [phone] (Phone) [phone] (Fax)

## 2018-07-24 NOTE — Telephone Encounter (Signed)
rx found and refaxed

## 2018-07-27 ENCOUNTER — Telehealth: Payer: No Typology Code available for payment source | Admitting: Family Medicine

## 2018-07-27 ENCOUNTER — Encounter: Payer: Self-pay | Admitting: Family Medicine

## 2018-07-27 DIAGNOSIS — R319 Hematuria, unspecified: Secondary | ICD-10-CM | POA: Diagnosis not present

## 2018-07-27 DIAGNOSIS — N390 Urinary tract infection, site not specified: Secondary | ICD-10-CM

## 2018-07-27 MED ORDER — NITROFURANTOIN MONOHYD MACRO 100 MG PO CAPS: 100.0000 mg | ORAL_CAPSULE | Freq: Two times a day (BID) | ORAL | 0 refills | Status: AC

## 2018-07-27 MED FILL — NITROFURANTOIN MONO-MCR 100: 100 | 7 days supply | Qty: 14 | Fill #0

## 2018-07-27 NOTE — Progress Notes (Signed)
We are sorry that you are not feeling well.  Here is how we plan to help!  Based on what you shared with me it looks like you most likely have a simple urinary tract infection.  A UTI (Urinary Tract Infection) is a bacterial infection of the bladder.  Most cases of urinary tract infections are simple to treat but a key part of your care is to encourage you to drink plenty of fluids and watch your symptoms carefully.  I have prescribed Macrobid BID for 7 days. Your symptoms should gradually improve. Call us if the burning in your urine worsens, you develop worsening fever, back pain or pelvic pain or if your symptoms do not resolve after completing the antibiotic.  I have a mild concern that this UTI could develop in to a kidney infection so if you experience back pain, fever, abdominal pain and no relief of symptoms within 24-48 hours of taking medication- I advised you to seek care face to face for evaluation to prevent complications.   Urinary tract infections can be prevented by drinking plenty of water to keep your body hydrated.  Also be sure when you wipe, wipe from front to back and don't hold it in!  If possible, empty your bladder every 4 hours.  Your e-visit answers were reviewed by a board certified advanced clinical practitioner to complete your personal care plan.  Depending on the condition, your plan could have included both over the counter or prescription medications.  If there is a problem please reply  once you have received a response from your provider.  Your safety is important to [place].  If you have drug allergies check your prescription carefully.    You can use MyChart to ask questions about today's visit, request a non-urgent call back, or ask for a work or school excuse for 24 hours related to this e-Visit. If it has been greater than 24 hours you will need to follow up with your provider, or enter a new e-Visit to address those concerns.   You will get an e-mail in the  next two days asking about your experience.  I hope that your e-visit has been valuable and will speed your recovery. Thank you for using e-visits.

## 2018-09-09 ENCOUNTER — Other Ambulatory Visit: Payer: Self-pay | Admitting: Family Medicine

## 2018-09-09 DIAGNOSIS — S39012A Strain of muscle, fascia and tendon of lower back, initial encounter: Secondary | ICD-10-CM

## 2018-09-09 MED FILL — ESTRADIOL 0.5 MG TABLET: 0.5 | 30 days supply | Qty: 30 | Fill #0 | Status: TO

## 2018-09-11 ENCOUNTER — Other Ambulatory Visit: Payer: Self-pay | Admitting: Family Medicine

## 2018-09-11 ENCOUNTER — Encounter: Payer: Self-pay | Admitting: Family Medicine

## 2018-09-11 DIAGNOSIS — S39012A Strain of muscle, fascia and tendon of lower back, initial encounter: Secondary | ICD-10-CM

## 2018-09-11 MED ORDER — NORTRIPTYLINE HCL 25 MG PO CAPS: 50.0000 mg | ORAL_CAPSULE | Freq: Every day | ORAL | 0 refills | Status: DC

## 2018-09-11 MED FILL — AMPHETAMINE-DEXTROAMPHETAMI: 10 | 30 days supply | Qty: 60 | Fill #0

## 2018-09-11 MED FILL — NORTRIPTYLINE HCL 25 MG CAP: 25 | 90 days supply | Qty: 180 | Fill #0

## 2018-10-07 MED FILL — clonazePAM 1 MG TABS: 1 | 90 days supply | Qty: 90 | Fill #0

## 2018-10-07 MED FILL — ESTRADIOL 0.5 MG TABS: 0.5 | 30 days supply | Qty: 30 | Fill #0

## 2018-11-05 MED FILL — ESTRADIOL 0.5 MG TABS: 0.5 | 90 days supply | Qty: 90 | Fill #1

## 2018-11-05 MED FILL — AMPHETAMINE-DEXTROAMPHETAMI: 10 | 30 days supply | Qty: 60 | Fill #0

## 2019-01-05 ENCOUNTER — Other Ambulatory Visit: Payer: Self-pay | Admitting: Internal Medicine

## 2019-01-05 DIAGNOSIS — S39012A Strain of muscle, fascia and tendon of lower back, initial encounter: Secondary | ICD-10-CM

## 2019-01-05 NOTE — Telephone Encounter (Signed)
Pt called stating her refill for nortiptyline was denied. It was originally prescribed by Dr. Raeford Razor, since he has left, do you want to refill this medication or does the patient need to be seen?

## 2019-01-06 MED FILL — AMPHETAMINE-DEXTROAMPHETAMI: 10 | 30 days supply | Qty: 60 | Fill #0

## 2019-01-07 MED FILL — clonazePAM 1 MG TABS: 1 | 90 days supply | Qty: 90 | Fill #0

## 2019-01-20 ENCOUNTER — Other Ambulatory Visit: Payer: Self-pay | Admitting: Internal Medicine

## 2019-01-20 DIAGNOSIS — S39012A Strain of muscle, fascia and tendon of lower back, initial encounter: Secondary | ICD-10-CM

## 2019-01-20 DIAGNOSIS — M797 Fibromyalgia: Secondary | ICD-10-CM

## 2019-01-20 MED ORDER — NORTRIPTYLINE HCL 25 MG PO CAPS: 50.0000 mg | ORAL_CAPSULE | Freq: Every day | ORAL | 0 refills | Status: DC

## 2019-01-20 MED FILL — NORTRIPTYLINE HCL 25 MG CAP: 25 | 90 days supply | Qty: 180 | Fill #0

## 2019-02-09 MED FILL — ESTRADIOL 0.5 MG TABLET: 0.5 | 90 days supply | Qty: 90 | Fill #0

## 2019-02-11 MED FILL — AMPHETAMINE-DEXTROAMPHETAMI: 10 | 30 days supply | Qty: 60 | Fill #0

## 2019-03-02 ENCOUNTER — Encounter: Payer: Self-pay | Admitting: Internal Medicine

## 2019-03-03 ENCOUNTER — Other Ambulatory Visit: Payer: Self-pay | Admitting: Internal Medicine

## 2019-03-03 DIAGNOSIS — M797 Fibromyalgia: Secondary | ICD-10-CM

## 2019-03-03 MED ORDER — NALTREXONE HCL POWD: 5.0000 mg | Freq: Every day | 1 refills | Status: DC

## 2019-03-10 ENCOUNTER — Telehealth: Payer: No Typology Code available for payment source | Admitting: Family

## 2019-03-10 DIAGNOSIS — R21 Rash and other nonspecific skin eruption: Secondary | ICD-10-CM

## 2019-03-10 MED ORDER — PREDNISONE 10 MG (21) PO TBPK: ORAL_TABLET | ORAL | 0 refills | Status: DC

## 2019-03-10 MED FILL — predniSONE 10 MG TABS: 10 | 6 days supply | Qty: 21 | Fill #0

## 2019-03-10 NOTE — Progress Notes (Signed)
E Visit for Rash  We are sorry that you are not feeling well. Here is how we plan to help!  Based on what you shared with me it looks like you have contact dermatitis.  Contact dermatitis is a skin rash caused by something that touches the skin and causes irritation or inflammation.  Your skin may be red, swollen, dry, cracked, and itch.  The rash should go away in a few days but can last a few weeks.  If you get a rash, it's important to figure out what caused it so the irritant can be avoided in the future. I have sent in a prednisone dose pack.   Approximately 5 minutes was spent documenting and reviewing patient's chart.    HOME CARE:   Take cool showers and avoid direct sunlight.  Apply cool compress or wet dressings.  Take a bath in an oatmeal bath.  Sprinkle content of one Aveeno packet under running faucet with comfortably warm water.  Bathe for 15-20 minutes, 1-2 times daily.  Pat dry with a towel. Do not rub the rash.  Use hydrocortisone cream.  Take an antihistamine like Benadryl for widespread rashes that itch.  The adult dose of Benadryl is 25-50 mg by mouth 4 times daily.  Caution:  This type of medication may cause sleepiness.  Do not drink alcohol, drive, or operate dangerous machinery while taking antihistamines.  Do not take these medications if you have prostate enlargement.  Read package instructions thoroughly on all medications that you take.  GET HELP RIGHT AWAY IF:   Symptoms don't go away after treatment.  Severe itching that persists.  If you rash spreads or swells.  If you rash begins to smell.  If it blisters and opens or develops a yellow-brown crust.  You develop a fever.  You have a sore throat.  You become short of breath.  MAKE SURE YOU:  Understand these instructions. Will watch your condition. Will get help right away if you are not doing well or get worse.  Thank you for choosing an e-visit. Your e-visit answers were reviewed by a  board certified advanced clinical practitioner to complete your personal care plan. Depending upon the condition, your plan could have included both over the counter or prescription medications. Please review your pharmacy choice. Be sure that the pharmacy you have chosen is open so that you can pick up your prescription now.  If there is a problem you may message your provider in [place] to have the prescription routed to another pharmacy. Your safety is important to [place]. If you have drug allergies check your prescription carefully.  For the next 24 hours, you can use MyChart to ask questions about today's visit, request a non-urgent call back, or ask for a work or school excuse from your e-visit provider. You will get an email in the next two days asking about your experience. I hope that your e-visit has been valuable and will speed your recovery.

## 2019-03-18 ENCOUNTER — Telehealth: Payer: No Typology Code available for payment source | Admitting: Physician Assistant

## 2019-03-18 DIAGNOSIS — R21 Rash and other nonspecific skin eruption: Secondary | ICD-10-CM

## 2019-03-18 MED ORDER — CEPHALEXIN 500 MG PO CAPS: 500.0000 mg | ORAL_CAPSULE | Freq: Four times a day (QID) | ORAL | 0 refills | Status: DC

## 2019-03-18 MED FILL — CEPHALEXIN 500 MG CAPSULE: 500 | 5 days supply | Qty: 20 | Fill #0

## 2019-03-18 NOTE — Progress Notes (Signed)
E Visit for Rash  We are sorry that you are not feeling well. Here is how we plan to help!  It is hard to tell from the photos if this is exactly poison ivy. Especially if it is getting worse after prednisone and now has yellow blisters. This may be a bacterial infection in the skin called impetigo.  Impetigo is treated with antibiotic pills. If you are still itchy you can continue to take prednisone.   I would avoid any topical creams or ointments as it may be further irritating your skin.   If this medication does not help or you get worse then you must see a dermatologist or other provider in person   HOME CARE:   Take cool showers and avoid direct sunlight.  Apply cool compress or wet dressings.  Take a bath in an oatmeal bath.  Sprinkle content of one Aveeno packet under running faucet with comfortably warm water.  Bathe for 15-20 minutes, 1-2 times daily.  Pat dry with a towel. Do not rub the rash.  Use hydrocortisone cream.  Take an antihistamine like Benadryl for widespread rashes that itch.  The adult dose of Benadryl is 25-50 mg by mouth 4 times daily.  Caution:  This type of medication may cause sleepiness.  Do not drink alcohol, drive, or operate dangerous machinery while taking antihistamines.  Do not take these medications if you have prostate enlargement.  Read package instructions thoroughly on all medications that you take.  GET HELP RIGHT AWAY IF:   Symptoms don't go away after treatment.  Severe itching that persists.  If you rash spreads or swells.  If you rash begins to smell.  If it blisters and opens or develops a yellow-brown crust.  You develop a fever.  You have a sore throat.  You become short of breath.  MAKE SURE YOU:  Understand these instructions. Will watch your condition. Will get help right away if you are not doing well or get worse.  Thank you for choosing an e-visit. Your e-visit answers were reviewed by a board certified  advanced clinical practitioner to complete your personal care plan. Depending upon the condition, your plan could have included both over the counter or prescription medications. Please review your pharmacy choice. Be sure that the pharmacy you have chosen is open so that you can pick up your prescription now.  If there is a problem you may message your provider in [place] to have the prescription routed to another pharmacy. Your safety is important to [place]. If you have drug allergies check your prescription carefully.  For the next 24 hours, you can use MyChart to ask questions about today's visit, request a non-urgent call back, or ask for a work or school excuse from your e-visit provider. You will get an email in the next two days asking about your experience. I hope that your e-visit has been valuable and will speed your recovery.     I spent 5 minutes on this patient

## 2019-03-21 ENCOUNTER — Encounter: Payer: Self-pay | Admitting: Emergency Medicine

## 2019-03-21 ENCOUNTER — Other Ambulatory Visit: Payer: Self-pay

## 2019-03-21 ENCOUNTER — Emergency Department (INDEPENDENT_AMBULATORY_CARE_PROVIDER_SITE_OTHER)
Admission: EM | Admit: 2019-03-21 | Discharge: 2019-03-21 | Disposition: A | Discharge status: Home / Self Care | Payer: No Typology Code available for payment source | Source: Home / Self Care | Attending: Family Medicine | Admitting: Family Medicine

## 2019-03-21 DIAGNOSIS — L03115 Cellulitis of right lower limb: Secondary | ICD-10-CM

## 2019-03-21 DIAGNOSIS — L03116 Cellulitis of left lower limb: Secondary | ICD-10-CM

## 2019-03-21 MED ORDER — CLINDAMYCIN HCL 300 MG PO CAPS: ORAL_CAPSULE | ORAL | 0 refills | Status: DC

## 2019-03-21 MED ORDER — TRIAMCINOLONE ACETONIDE 0.1 % EX CREA: TOPICAL_CREAM | CUTANEOUS | 0 refills | Status: DC

## 2019-03-21 NOTE — Discharge Instructions (Signed)
May take Benadryl at bedtime if needed for itching.  If symptoms become significantly worse during the night or over the weekend, proceed to the local emergency room.

## 2019-03-21 NOTE — ED Triage Notes (Signed)
Patient was diagnosed with poison ivy rash about 3 weeks ago; she did home care then went to PCP and was given prednisone, which she completed; she developed impetigo and was placed on Keflex which has given her hives. Rash is primarily on legs with scattered lesions buttocks and now face. She took benadryl yesterday at 1800; zyrtec HS. She could not sleep last night.  she has not travelled past 4 weeks.

## 2019-03-21 NOTE — ED Provider Notes (Signed)
[facility]    CSN: [id number] Arrival date & time: 03/21/19  [id number]      History   Chief Complaint Chief Complaint  Patient presents with  . Rash    HPI Janice Spencer is a 53 y.o. female.   Patient developed a poison ivy rash on her lower legs about 3 weeks ago, and was prescribed a course of prednisone through an e-visit.  The rash improved slightly but persisted, developing a clear yellowish exudate.  Through another e-visit 4 days ago she was then prescribed Keflex.  She subsequently developed hives requiring her to discontinue the Keflex.  Her lower leg rash became more pruritic yesterday responding poorly to Benadryl and Zyrtec.  She has now developed a few scattered lesions on her buttocks and face.  She had mild chills/sweats yesterday.  The history is provided by the patient.  Rash Location: lower legs. Quality: burning, itchiness, painful, redness and weeping   Quality: not blistering and not swelling   Pain details:    Quality:  Itching and burning   Severity:  Mild   Onset quality:  Gradual   Duration:  3 weeks   Timing:  Constant   Progression:  Worsening Severity:  Moderate Onset quality:  Gradual Duration:  3 weeks Timing:  Constant Progression:  Worsening Chronicity:  New Context: plant contact   Relieved by:  Nothing Worsened by:  Contact Ineffective treatments:  Antihistamines (prednisone and Keflex) Associated symptoms: no diarrhea, no fatigue, no fever, no induration, no joint pain, no myalgias, no shortness of breath and no sore throat     Past Medical History:  Diagnosis Date  . Anxiety   . Depression   . Endometriosis   . IBS (irritable bowel syndrome)     Patient Active Problem List   Diagnosis Date Noted  . Cervical radiculopathy 04/02/2018  . Hypocalcemia 12/20/2015  . 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 Low back strain 03/24/2015  . Routine general medical examination at a health care facility 07/18/2014   . Fibromyalgia muscle pain 10/18/2011  . 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 CHOLECYSTECTOMY    . nasal bone spur removed    . VAGINAL HYSTERECTOMY      OB History   No obstetric history on file.      Home Medications    Prior to Admission medications   Medication Sig Start Date End Date Taking? Authorizing Provider  amphetamine-dextroamphetamine (ADDERALL) 10 MG tablet Take 10 mg by mouth daily with breakfast.    [provider]  clindamycin (CLEOCIN) 300 MG capsule Take one cap PO Q8hr 03/21/19   Kandra Nicolas, MD  estrogens, conjugated, (PREMARIN) 1.25 MG tablet Take 1.25 mg by mouth daily.    [provider]  Naltrexone HCl POWD Take 5 mg by mouth daily. 03/03/19   Janith Lima, MD  nortriptyline (PAMELOR) 25 MG capsule Take 2 capsules (50 mg total) by mouth at bedtime. 01/20/19   Janith Lima, MD  triamcinolone cream (KENALOG) 0.1 % Apply thin film to affected areas BID 03/21/19   Kandra Nicolas, MD    Family History Family History  Problem Relation Age of Onset  . Hypertension Mother   . Goiter Mother   . Heart disease Father   . Cancer Neg Hx   . Alcohol abuse Neg Hx   . Stroke Neg Hx     Social History Social History  Tobacco Use  . Smoking status: Former Smoker    Packs/day: 0.25    Quit date: 05/12/2008    Years since quitting: 10.8  . Smokeless tobacco: Never Used  Substance Use Topics  . Alcohol use: Yes    Alcohol/week: 2.0 standard drinks    Types: 2 Glasses of wine per week  . Drug use: No     Allergies   Cymbalta [duloxetine hcl], Flagyl [metronidazole], Belsomra [suvorexant], Codeine, Keflex [cephalexin], Morphine, and Penicillins   Review of Systems Review of Systems  Constitutional: Positive for chills and diaphoresis. Negative for fatigue and fever.  HENT: Negative for sore throat.   Respiratory: Negative for shortness of breath.   Gastrointestinal:  Negative for diarrhea.  Musculoskeletal: Negative for arthralgias and myalgias.  Skin: Positive for color change and rash.  All other systems reviewed and are negative.    Physical Exam Triage Vital Signs ED Triage Vitals [03/21/19 0947]  Enc Vitals Group     BP 110/73     Pulse Rate 93     Resp 17     Temp 98.4 F (36.9 C)     Temp Source Oral     SpO2 100 %     Weight 134 lb 8 oz (61 kg)     Height 5\' 4"  (1.626 m)     Head Circumference      Peak Flow      Pain Score      Pain Loc      Pain Edu?      Excl. in [place]?    No data found.  Updated Vital Signs BP 110/73 (BP Location: Right Arm)   Pulse 93   Temp 98.4 F (36.9 C) (Oral)   Resp 18   Ht 5\' 4"  (1.626 m)   Wt 60.8 kg   SpO2 100%   BMI 23.00 kg/m   Visual Acuity Right Eye Distance:   Left Eye Distance:   Bilateral Distance:    Right Eye Near:   Left Eye Near:    Bilateral Near:     Physical Exam Vitals signs and nursing note reviewed.  Constitutional:      General: She is not in acute distress. HENT:     Head: Normocephalic.     Right Ear: External ear normal.     Left Ear: External ear normal.     Nose: Nose normal.     Mouth/Throat:     Pharynx: Oropharynx is clear.  Eyes:     Pupils: Pupils are equal, round, and reactive to light.  Cardiovascular:     Rate and Rhythm: Normal rate.  Pulmonary:     Effort: Pulmonary effort is normal.  Musculoskeletal:     Right lower leg: No edema.     Left lower leg: No edema.  Lymphadenopathy:     Cervical: No cervical adenopathy.  Skin:    General: Skin is warm and dry.          Comments: Lower legs below the knees have diffusely scattered dried superficial excoriations surrounded by mild erythema.  The areas are not tender to palpation.  There is a small erythematous excoriation on her left buttock  as noted on diagram.   Neurological:     Mental Status: She is alert.      UC Treatments / Results  Labs (all labs ordered are listed, but only  abnormal results are displayed) Labs Reviewed  WOUND CULTURE    EKG   Radiology No results  found.  Procedures Procedures (including critical care time)  Medications Ordered in UC Medications - No data to display  Initial Impression / Assessment and Plan / UC Course  I have reviewed the triage vital signs and the nursing notes.  Pertinent labs & imaging results that were available during my care of the patient were reviewed by me and considered in my medical decision making (see chart for details).     Suspect staph cellulitis at this time.  Wound culture pending from two excoriations on left leg. Begin Clindamycin 300mg  Q8hr, and topical triamcinolone cream. Recommend follow-up with dermatologist.  Final Clinical Impressions(s) / UC Diagnoses   Final diagnoses:  Cellulitis of right lower extremity  Cellulitis of left lower extremity     Discharge Instructions     May take Benadryl at bedtime if needed for itching.  If symptoms become significantly worse during the night or over the weekend, proceed to the local emergency room.     ED Prescriptions    Medication Sig Dispense Auth. Provider   clindamycin (CLEOCIN) 300 MG capsule Take one cap PO Q8hr 21 capsule Kandra Nicolas, MD   triamcinolone cream (KENALOG) 0.1 % Apply thin film to affected areas BID 45 g Kandra Nicolas, MD         Kandra Nicolas, MD 03/25/19 1329

## 2019-03-23 ENCOUNTER — Encounter: Payer: Self-pay | Admitting: Internal Medicine

## 2019-03-24 ENCOUNTER — Encounter: Payer: Self-pay | Admitting: Internal Medicine

## 2019-03-24 ENCOUNTER — Telehealth: Payer: Self-pay | Admitting: Emergency Medicine

## 2019-03-24 ENCOUNTER — Encounter (HOSPITAL_COMMUNITY): Payer: Self-pay

## 2019-03-24 ENCOUNTER — Telehealth (HOSPITAL_COMMUNITY): Payer: Self-pay | Admitting: Emergency Medicine

## 2019-03-24 DIAGNOSIS — L309 Dermatitis, unspecified: Secondary | ICD-10-CM

## 2019-03-24 LAB — WOUND CULTURE
MICRO NUMBER:: 876720
SPECIMEN QUALITY:: ADEQUATE

## 2019-03-24 MED ORDER — DOXYCYCLINE HYCLATE 100 MG PO CAPS: 100.0000 mg | ORAL_CAPSULE | Freq: Two times a day (BID) | ORAL | 0 refills | Status: AC

## 2019-03-24 NOTE — Telephone Encounter (Signed)
Culture shows staphylococcus aureus, pt was given clindamycin which shows resistance. Spoke with Dr. Joseph Art, per MD to send doxy 100mg  BID x7 days.   Attempted to reach patient. No answer at this time. Voicemail left.

## 2019-03-24 NOTE — Telephone Encounter (Signed)
Asked patient to call about wound culture results; her rx Clindamycin is not going to be effective.

## 2019-03-29 MED FILL — FLUOCINONIDE 0.05% CREAM: 0.05 | 30 days supply | Qty: 120 | Fill #0

## 2019-03-29 MED FILL — LEVOCETIRIZINE 5 MG TABLET: 5 | 30 days supply | Qty: 30 | Fill #0

## 2019-03-31 MED FILL — AMPHETAMINE-DEXTROAMPHETAMI: 10 | 30 days supply | Qty: 60 | Fill #0

## 2019-04-07 MED FILL — clonazePAM 1 MG TABS: 1 | 90 days supply | Qty: 90 | Fill #0

## 2019-05-11 MED FILL — AMPHETAMINE-DEXTROAMPHETAMI: 10 | 30 days supply | Qty: 60 | Fill #0

## 2019-05-11 MED FILL — ESTRADIOL 0.5 MG TABLET: 0.5 | 90 days supply | Qty: 90 | Fill #1

## 2019-05-13 ENCOUNTER — Encounter: Payer: Self-pay | Admitting: Internal Medicine

## 2019-05-13 ENCOUNTER — Ambulatory Visit (INDEPENDENT_AMBULATORY_CARE_PROVIDER_SITE_OTHER): Payer: No Typology Code available for payment source | Admitting: Internal Medicine

## 2019-05-13 ENCOUNTER — Other Ambulatory Visit (INDEPENDENT_AMBULATORY_CARE_PROVIDER_SITE_OTHER): Payer: No Typology Code available for payment source

## 2019-05-13 ENCOUNTER — Other Ambulatory Visit: Payer: Self-pay

## 2019-05-13 VITALS — BP 110/70 | HR 93 | Temp 97.9°F | Ht 64.0 in | Wt 135.2 lb

## 2019-05-13 DIAGNOSIS — M797 Fibromyalgia: Secondary | ICD-10-CM

## 2019-05-13 DIAGNOSIS — Z0000 Encounter for general adult medical examination without abnormal findings: Secondary | ICD-10-CM

## 2019-05-13 LAB — BASIC METABOLIC PANEL
BUN: 13 mg/dL (ref 6–23)
CO2: 28 mEq/L (ref 19–32)
Calcium: 9.3 mg/dL (ref 8.4–10.5)
Chloride: 105 mEq/L (ref 96–112)
Creatinine, Ser: 0.81 mg/dL (ref 0.40–1.20)
GFR: 73.94 mL/min (ref >60.00)
Glucose, Bld: 117 mg/dL — ABNORMAL HIGH (ref 70–99)
Potassium: 4 mEq/L (ref 3.5–5.1)
Sodium: 141 mEq/L (ref 135–145)

## 2019-05-13 LAB — LIPID PANEL
Cholesterol: 218 mg/dL — ABNORMAL HIGH (ref 0–200)
HDL: 67.3 mg/dL (ref >39.00)
LDL Cholesterol: 136 mg/dL — ABNORMAL HIGH (ref 0–99)
NonHDL: 150.49
Total CHOL/HDL Ratio: 3
Triglycerides: 74 mg/dL (ref 0.0–149.0)
VLDL: 14.8 mg/dL (ref 0.0–40.0)

## 2019-05-13 LAB — TSH: TSH: 1.11 u[IU]/mL (ref 0.35–4.50)

## 2019-05-13 NOTE — Patient Instructions (Signed)
Health Maintenance, Female Adopting a healthy lifestyle and getting preventive care are important in promoting health and wellness. Ask your health care provider about:  The right schedule for you to have regular tests and exams.  Things you can do on your own to prevent diseases and keep yourself healthy. What should I know about diet, weight, and exercise? Eat a healthy diet   Eat a diet that includes plenty of vegetables, fruits, low-fat dairy products, and lean protein.  Do not eat a lot of foods that are high in solid fats, added sugars, or sodium. Maintain a healthy weight Body mass index (BMI) is used to identify weight problems. It estimates body fat based on height and weight. Your health care provider can help determine your BMI and help you achieve or maintain a healthy weight. Get regular exercise Get regular exercise. This is one of the most important things you can do for your health. Most adults should:  Exercise for at least 150 minutes each week. The exercise should increase your heart rate and make you sweat (moderate-intensity exercise).  Do strengthening exercises at least twice a week. This is in addition to the moderate-intensity exercise.  Spend less time sitting. Even light physical activity can be beneficial. Watch cholesterol and blood lipids Have your blood tested for lipids and cholesterol at 53 years of age, then have this test every 5 years. Have your cholesterol levels checked more often if:  Your lipid or cholesterol levels are high.  You are older than 53 years of age.  You are at high risk for heart disease. What should I know about cancer screening? Depending on your health history and family history, you may need to have cancer screening at various ages. This may include screening for:  Breast cancer.  Cervical cancer.  Colorectal cancer.  Skin cancer.  Lung cancer. What should I know about heart disease, diabetes, and high blood  pressure? Blood pressure and heart disease  High blood pressure causes heart disease and increases the risk of stroke. This is more likely to develop in people who have high blood pressure readings, are of African descent, or are overweight.  Have your blood pressure checked: ? Every 3-5 years if you are 18-39 years of age. ? Every year if you are 40 years old or older. Diabetes Have regular diabetes screenings. This checks your fasting blood sugar level. Have the screening done:  Once every three years after age 40 if you are at a normal weight and have a low risk for diabetes.  More often and at a younger age if you are overweight or have a high risk for diabetes. What should I know about preventing infection? Hepatitis B If you have a higher risk for hepatitis B, you should be screened for this virus. Talk with your health care provider to find out if you are at risk for hepatitis B infection. Hepatitis C Testing is recommended for:  Everyone born from 1945 through 1965.  Anyone with known risk factors for hepatitis C. Sexually transmitted infections (STIs)  Get screened for STIs, including gonorrhea and chlamydia, if: ? You are sexually active and are younger than 53 years of age. ? You are older than 53 years of age and your health care provider tells you that you are at risk for this type of infection. ? Your sexual activity has changed since you were last screened, and you are at increased risk for chlamydia or gonorrhea. Ask your health care provider if   you are at risk.  Ask your health care provider about whether you are at high risk for HIV. Your health care provider may recommend a prescription medicine to help prevent HIV infection. If you choose to take medicine to prevent HIV, you should first get tested for HIV. You should then be tested every 3 months for as long as you are taking the medicine. Pregnancy  If you are about to stop having your period (premenopausal) and  you may become pregnant, seek counseling before you get pregnant.  Take 400 to 800 micrograms (mcg) of folic acid every day if you become pregnant.  Ask for birth control (contraception) if you want to prevent pregnancy. Osteoporosis and menopause Osteoporosis is a disease in which the bones lose minerals and strength with aging. This can result in bone fractures. If you are 65 years old or older, or if you are at risk for osteoporosis and fractures, ask your health care provider if you should:  Be screened for bone loss.  Take a calcium or vitamin D supplement to lower your risk of fractures.  Be given hormone replacement therapy (HRT) to treat symptoms of menopause. Follow these instructions at home: Lifestyle  Do not use any products that contain nicotine or tobacco, such as cigarettes, e-cigarettes, and chewing tobacco. If you need help quitting, ask your health care provider.  Do not use street drugs.  Do not share needles.  Ask your health care provider for help if you need support or information about quitting drugs. Alcohol use  Do not drink alcohol if: ? Your health care provider tells you not to drink. ? You are pregnant, may be pregnant, or are planning to become pregnant.  If you drink alcohol: ? Limit how much you use to 0-1 drink a day. ? Limit intake if you are breastfeeding.  Be aware of how much alcohol is in your drink. In the [place] one drink equals one 12 oz bottle of beer (355 mL), one 5 oz glass of wine (148 mL), or one 1 oz glass of hard liquor (44 mL). General instructions  Schedule regular health, dental, and eye exams.  Stay current with your vaccines.  Tell your health care provider if: ? You often feel depressed. ? You have ever been abused or do not feel safe at home. Summary  Adopting a healthy lifestyle and getting preventive care are important in promoting health and wellness.  Follow your health care provider's instructions about healthy  diet, exercising, and getting tested or screened for diseases.  Follow your health care provider's instructions on monitoring your cholesterol and blood pressure. This information is not intended to replace advice given to you by your health care provider. Make sure you discuss any questions you have with your health care provider. Document Released: 01/07/2011 Document Revised: 06/17/2018 Document Reviewed: 06/17/2018 Elsevier Patient Education  2020 Elsevier Inc[employer]  

## 2019-05-13 NOTE — Progress Notes (Signed)
Subjective:  Patient ID: Janice Spencer, female    DOB: January 11, 1966  Age: 53 y.o. MRN: [id number]  CC: Annual Exam   HPI VANIA BUDHRAM presents for a CPX.  She feels well today and offers no new complaints.  She continues to struggle with myalgias but says that her symptoms are well controlled with the current meds.  She has a history of hypocalcemia.  Outpatient Medications Prior to Visit  Medication Sig Dispense Refill  . amphetamine-dextroamphetamine (ADDERALL) 10 MG tablet Take 10 mg by mouth daily with breakfast.    . estrogens, conjugated, (PREMARIN) 1.25 MG tablet Take 1.25 mg by mouth daily.    . Naltrexone HCl POWD Take 5 mg by mouth daily. 4.5 g 1  . nortriptyline (PAMELOR) 25 MG capsule Take 2 capsules (50 mg total) by mouth at bedtime. 180 capsule 0  . clindamycin (CLEOCIN) 300 MG capsule Take one cap PO Q8hr 21 capsule 0  . triamcinolone cream (KENALOG) 0.1 % Apply thin film to affected areas BID 45 g 0   No facility-administered medications prior to visit.     ROS Review of Systems  Constitutional: Negative.  Negative for diaphoresis, fatigue and unexpected weight change.  HENT: Negative.   Respiratory: Negative.  Negative for cough, chest tightness, shortness of breath and wheezing.   Cardiovascular: Negative for chest pain, palpitations and leg swelling.  Gastrointestinal: Negative for abdominal pain, constipation, diarrhea, nausea and vomiting.  Endocrine: Negative.   Genitourinary: Negative.  Negative for difficulty urinating.  Musculoskeletal: Positive for myalgias. Negative for arthralgias, back pain and neck pain.  Skin: Negative.  Negative for color change and pallor.  Neurological: Negative.  Negative for dizziness, weakness, light-headedness and headaches.  Hematological: Negative for adenopathy. Does not bruise/bleed easily.  Psychiatric/Behavioral: Negative.     Objective:  BP 110/70 (BP Location: Left Arm, Patient Position: Sitting, Cuff Size:  Normal)   Pulse 93   Temp 97.9 F (36.6 C) (Oral)   Ht 5\' 4"  (1.626 m)   Wt 135 lb 4 oz (61.3 kg)   SpO2 98%   BMI 23.22 kg/m   BP Readings from Last 3 Encounters:  05/13/19 110/70  03/21/19 110/73  04/21/18 100/70    Wt Readings from Last 3 Encounters:  05/13/19 135 lb 4 oz (61.3 kg)  03/21/19 134 lb (60.8 kg)  04/21/18 129 lb 8 oz (58.7 kg)    Physical Exam Vitals signs reviewed.  Constitutional:      Appearance: Normal appearance.  HENT:     Nose: Nose normal.     Mouth/Throat:     Mouth: Mucous membranes are moist.  Eyes:     General: No scleral icterus.    Conjunctiva/sclera: Conjunctivae normal.  Neck:     Musculoskeletal: Neck supple.  Cardiovascular:     Rate and Rhythm: Normal rate and regular rhythm.     Heart sounds: No murmur.  Pulmonary:     Effort: Pulmonary effort is normal.     Breath sounds: No stridor. No wheezing or rhonchi.  Abdominal:     General: Abdomen is flat. Bowel sounds are normal. There is no distension.     Palpations: Abdomen is soft. There is no hepatomegaly or splenomegaly.     Tenderness: There is no abdominal tenderness.  Musculoskeletal: Normal range of motion.     Right lower leg: No edema.     Left lower leg: No edema.  Lymphadenopathy:     Cervical: No cervical adenopathy.  Neurological:  General: No focal deficit present.     Mental Status: She is alert.  Psychiatric:        Mood and Affect: Mood normal.        Behavior: Behavior normal.        Thought Content: Thought content normal.        Judgment: Judgment normal.     Lab Results  Component Value Date   WBC 5.1 04/01/2017   HGB 14.0 04/01/2017   HCT 42.0 04/01/2017   PLT 233.0 04/01/2017   GLUCOSE 117 (H) 05/13/2019   CHOL 218 (H) 05/13/2019   TRIG 74.0 05/13/2019   HDL 67.30 05/13/2019   LDLCALC 136 (H) 05/13/2019   ALT 9 04/01/2017   AST 14 04/01/2017   NA 141 05/13/2019   K 4.0 05/13/2019   CL 105 05/13/2019   CREATININE 0.81 05/13/2019    BUN 13 05/13/2019   CO2 28 05/13/2019   TSH 1.11 05/13/2019   HGBA1C 5.6 12/13/2015    No results found.  Assessment & Plan:   Latresia was seen today for annual exam.  Diagnoses and all orders for this visit:  Routine general medical examination at a health care facility- Exam completed, labs reviewed, vaccines reviewed and updated, colon cancer screening/Pap/mammogram are all up-to-date, patient education was given. -     Lipid panel; Future -     HIV antibody (Reflex); Future  Hypocalcemia- Her calcium level is normal now. -     Basic metabolic panel; Future -     TSH; Future  Fibromyalgia muscle pain- She is doing well on the current dose of naltrexone.  Will continue.   I have discontinued Taies[facility] Care. [facility]yer "Pam"'s clindamycin and triamcinolone cream. I am also having her maintain her amphetamine-dextroamphetamine, estrogens (conjugated), nortriptyline, and Naltrexone HCl.  No orders of the defined types were placed in this encounter.    Follow-up: Return if symptoms worsen or fail to improve.  Scarlette Calico, MD

## 2019-05-16 ENCOUNTER — Encounter: Payer: Self-pay | Admitting: Internal Medicine

## 2019-06-08 ENCOUNTER — Other Ambulatory Visit: Payer: Self-pay | Admitting: Internal Medicine

## 2019-06-08 DIAGNOSIS — M797 Fibromyalgia: Secondary | ICD-10-CM

## 2019-06-08 MED ORDER — NORTRIPTYLINE HCL 25 MG PO CAPS: 50.0000 mg | ORAL_CAPSULE | Freq: Every day | ORAL | 1 refills | Status: DC

## 2019-06-08 MED FILL — NORTRIPTYLINE HCL 25 MG CAP: 25 | 90 days supply | Qty: 180 | Fill #0

## 2019-06-16 MED FILL — AMPHETAMINE-DEXTROAMPHETAMI: 10 | 30 days supply | Qty: 60 | Fill #0

## 2019-07-05 MED FILL — clonazePAM 1 MG TABS: 1 | 90 days supply | Qty: 90 | Fill #1

## 2019-07-15 DIAGNOSIS — F411 Generalized anxiety disorder: Secondary | ICD-10-CM | POA: Diagnosis not present

## 2019-07-22 LAB — HM MAMMOGRAPHY: HM Mammogram: NORMAL (ref 0–4)

## 2019-07-22 LAB — HM PAP SMEAR

## 2019-08-03 MED FILL — ESTRADIOL 0.5 MG TABLET: 0.5 | 30 days supply | Qty: 30 | Fill #2

## 2019-08-18 ENCOUNTER — Encounter: Payer: Self-pay | Admitting: Internal Medicine

## 2019-08-18 ENCOUNTER — Other Ambulatory Visit: Payer: Self-pay | Admitting: Internal Medicine

## 2019-08-18 DIAGNOSIS — M25562 Pain in left knee: Secondary | ICD-10-CM

## 2019-08-18 DIAGNOSIS — G8929 Other chronic pain: Secondary | ICD-10-CM

## 2019-09-02 MED FILL — PROGESTERONE 100 MG CAPSULE: 100 | 90 days supply | Qty: 90 | Fill #0

## 2019-09-02 MED FILL — ESTRADIOL 0.05 MG/24HR PTTW: 0.05 | 84 days supply | Qty: 24 | Fill #0

## 2019-09-06 ENCOUNTER — Other Ambulatory Visit: Payer: Self-pay

## 2019-09-06 ENCOUNTER — Ambulatory Visit (INDEPENDENT_AMBULATORY_CARE_PROVIDER_SITE_OTHER): Payer: No Typology Code available for payment source

## 2019-09-06 ENCOUNTER — Encounter: Payer: Self-pay | Admitting: Family Medicine

## 2019-09-06 ENCOUNTER — Ambulatory Visit: Payer: No Typology Code available for payment source | Admitting: Family Medicine

## 2019-09-06 ENCOUNTER — Ambulatory Visit: Payer: Self-pay

## 2019-09-06 VITALS — BP 112/66 | HR 111 | Ht 64.0 in | Wt 136.8 lb

## 2019-09-06 DIAGNOSIS — M1712 Unilateral primary osteoarthritis, left knee: Secondary | ICD-10-CM

## 2019-09-06 DIAGNOSIS — G8929 Other chronic pain: Secondary | ICD-10-CM

## 2019-09-06 DIAGNOSIS — M25562 Pain in left knee: Secondary | ICD-10-CM | POA: Diagnosis not present

## 2019-09-06 NOTE — Progress Notes (Signed)
Subjective:    I'm seeing this patient as a consultation for:  Dr. Ronnald Ramp. Note will be routed back to referring provider/PCP.  CC: L knee pain  I, Molly Weber, LAT, ATC, am serving as scribe for Dr. Lynne Leader.  HPI: Pt is a 54 y/o female presenting w/ c/o L posterior knee pain and swelling x years.  She likes to workout and had been running up to 4-5days/week but has had to decrease her activity level due to pain.  She rates her pain at a 2-3/10 and describes her pain as nagging.  Symptoms have been going on chronically for about 10 years worsening at times.  As noted below she has had an injection with a previous sports medicine doctor Dr. Lorre Nick about 3 years ago that did not help much at all.  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  Radiating pain: Yes into the L calf Swelling: Yes inher R posterior knee L knee mechanical symptoms: No Instability: Yes, some giving-way Aggravating factors: Prolonged sitting; deep squatting Treatments tried: Prior knee injection w/ Dr. Lorre Nick w/ no relief; brace; IBU  Diagnostic testing: None  Past medical history, Surgical history, Family history, Social history, Allergies, and medications have been entered into the medical record, reviewed.   Review of Systems: No new headache, visual changes, nausea, vomiting, diarrhea, constipation, dizziness, abdominal pain, skin rash, fevers, chills, night sweats, weight loss, swollen lymph nodes, body aches, joint swelling, muscle aches, chest pain, shortness of breath, mood changes, visual or auditory hallucinations.   Objective:    Vitals:   09/06/19 0953  BP: 112/66  Pulse: (!) 111  SpO2: 98%   General: Well Developed, well nourished, and in no acute distress.  Neuro/Psych: Alert and oriented x3, extra-ocular muscles intact, able to move all 4 extremities, sensation grossly intact. Skin: Warm and dry, no rashes noted.  Respiratory: Not using accessory muscles, speaking in full sentences,  trachea midline.  Cardiovascular: Pulses palpable, no extremity edema. Abdomen: Does not appear distended. MSK: Left knee: Normal-appearing no significant effusion or deformity. Range of motion 0-120 degrees with mild crepitations. Not particularly tender to palpation. Intact strength to flexion extension. Stable ligamentous exam anterior drawer posterior drawer testing valgus and varus stress test. Negative McMurray's testing. Negative H test.  Lab and Radiology Results Diagnostic Limited MSK Ultrasound of: Left knee Quad tendon intact and normal appearing.  Trace suprapatellar effusion. Patellar tendon normal-appearing. Lateral joint line normal-appearing. Medial joint line slightly narrowed no definitive meniscus tear. Posterior knee no significant Baker's cyst visualized. Impression: Degenerative appearing changes at medial knee  X-ray images left knee obtained today personally independently reviewed. Mild to moderate DJD at medial compartment.  Minimal DJD patellofemoral compartment. Await formal radiology review   Impression and Recommendations:    Assessment and Plan: 54 y.o. female with chronic left knee pain.  Ongoing for years.  Patient has trial of conservative management including home exercise program and steroid injection in the past with little benefit..  Discussed options.  Plan for Voltaren gel compressive knee sleeve quad strengthening exercises.  Also will proceed with trial of medial off loader brace and hyaluronic acid injections.   Orders Placed This Encounter  Procedures  . [place] LIMITED JOINT SPACE STRUCTURES LOW LEFT(NO LINKED CHARGES)    Order Specific Question:   Reason for Exam (SYMPTOM  OR DIAGNOSIS REQUIRED)    Answer:   L knee pain    Order Specific Question:   Preferred imaging location?  Answer:   [facility]  . DG Knee 4 Views W/Patella Left    Standing Status:   Future    Number of Occurrences:   1    Standing  Expiration Date:   11/05/2020    Order Specific Question:   Reason for Exam (SYMPTOM  OR DIAGNOSIS REQUIRED)    Answer:   eval left knee pain    Order Specific Question:   Is patient pregnant?    Answer:   No    Order Specific Question:   Preferred imaging location?    Answer:   [facility]    Order Specific Question:   Radiology Contrast Protocol - do NOT remove file path    Answer:   \\[email or web address]   No orders of the defined types were placed in this encounter.   Discussed warning signs or symptoms. Please see discharge instructions. Patient expresses understanding.   The above documentation has been reviewed and is accurate and complete Lynne Leader

## 2019-09-06 NOTE — Patient Instructions (Signed)
Thank you for coming in today. Use volatren gel up to 4x daily.  Work on Astronomer.  Exercise bike is great for that.  Consider compressive knee sleeve during and 30-60 mins following activity.  Body Helix Full Knee Sleeve.  I will work on getting the gel shots approved.  I will also ask the [employer] rep to contact you about a custom medial off loader OA brace.  This may help some.  Keep me updated.

## 2019-09-07 ENCOUNTER — Encounter: Payer: Self-pay | Admitting: Internal Medicine

## 2019-09-07 NOTE — Progress Notes (Signed)
X-ray left knee largely normal-appearing with no fractures or severe arthritis.

## 2019-09-15 MED FILL — NORTRIPTYLINE HCL 25 MG CAP: 25 | 90 days supply | Qty: 180 | Fill #1

## 2019-09-15 MED FILL — AMPHETAMINE-DEXTROAMPHETAMI: 10 | 30 days supply | Qty: 60 | Fill #0

## 2019-09-16 ENCOUNTER — Other Ambulatory Visit: Payer: Self-pay | Admitting: Internal Medicine

## 2019-09-16 ENCOUNTER — Encounter: Payer: Self-pay | Admitting: Internal Medicine

## 2019-09-16 DIAGNOSIS — M797 Fibromyalgia: Secondary | ICD-10-CM

## 2019-09-16 MED ORDER — NALTREXONE HCL POWD: 5.0000 mg | Freq: Every day | 1 refills | Status: DC

## 2019-10-13 MED FILL — AMPHETAMINE-DEXTROAMPHETAMI: 10 | 30 days supply | Qty: 60 | Fill #0

## 2019-10-14 MED FILL — clonazePAM 1 MG TABS: 1 | 90 days supply | Qty: 90 | Fill #0

## 2019-10-18 ENCOUNTER — Encounter: Payer: Self-pay | Admitting: Family

## 2019-10-18 ENCOUNTER — Ambulatory Visit (INDEPENDENT_AMBULATORY_CARE_PROVIDER_SITE_OTHER): Payer: No Typology Code available for payment source | Admitting: Family

## 2019-10-18 ENCOUNTER — Other Ambulatory Visit: Payer: Self-pay

## 2019-10-18 VITALS — BP 106/72 | HR 110 | Temp 98.8°F | Ht 64.0 in | Wt 139.4 lb

## 2019-10-18 DIAGNOSIS — R21 Rash and other nonspecific skin eruption: Secondary | ICD-10-CM | POA: Diagnosis not present

## 2019-10-18 DIAGNOSIS — M791 Myalgia, unspecified site: Secondary | ICD-10-CM | POA: Diagnosis not present

## 2019-10-18 LAB — SEDIMENTATION RATE: Sed Rate: 2 mm/hr (ref 0–30)

## 2019-10-18 MED ORDER — HYDROXYZINE HCL 10 MG PO TABS: 10.0000 mg | ORAL_TABLET | Freq: Three times a day (TID) | ORAL | 0 refills | Status: DC | PRN

## 2019-10-18 MED ORDER — PERMETHRIN 5 % EX CREA: 1.0000 "application " | TOPICAL_CREAM | Freq: Once | CUTANEOUS | 0 refills | Status: AC

## 2019-10-18 MED ORDER — FAMOTIDINE 20 MG PO TABS: 20.0000 mg | ORAL_TABLET | Freq: Two times a day (BID) | ORAL | 0 refills | Status: DC

## 2019-10-18 MED FILL — FAMOTIDINE 20 MG TABS: 20 | 30 days supply | Qty: 60 | Fill #0

## 2019-10-18 MED FILL — PERMETHRIN 5 % CREA: 5 | 7 days supply | Qty: 60 | Fill #0

## 2019-10-18 MED FILL — hydrOXYzine HCL 10 MG TABS: 10 | 10 days supply | Qty: 30 | Fill #0

## 2019-10-18 NOTE — Progress Notes (Signed)
Janice Spencer is a 54 y.o. female with the following history as recorded in EpicCare:  Patient Active Problem List   Diagnosis Date Noted  . Primary osteoarthritis of left knee 09/06/2019  . Chronic pain of left knee 08/18/2019  . Cervical radiculopathy 04/02/2018  . Hypocalcemia 12/20/2015  . 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 Low back strain 03/24/2015  . Routine general medical examination at a health care facility 07/18/2014  . Fibromyalgia muscle pain 10/18/2011  . Regional enteritis [facility]) 09/13/2004    Current Outpatient Medications  Medication Sig Dispense Refill  . amphetamine-dextroamphetamine (ADDERALL) 10 MG tablet Take 10 mg by mouth daily with breakfast.    . clonazePAM (KLONOPIN) 1 MG tablet Take 0.5-1 mg by mouth at bedtime as needed.    Marland Kitchen estradiol (VIVELLE-DOT) 0.05 MG/24HR patch estradiol 0.05 mg/24 hr semiweekly transdermal patch    . Naltrexone HCl POWD Take 5 mg by mouth daily. 4.5 g 1  . nortriptyline (PAMELOR) 25 MG capsule Take 2 capsules (50 mg total) by mouth at bedtime. 180 capsule 1  . progesterone (PROMETRIUM) 100 MG capsule     . famotidine (PEPCID) 20 MG tablet Take 1 tablet (20 mg total) by mouth 2 (two) times daily. 60 tablet 0  . hydrOXYzine (ATARAX/VISTARIL) 10 MG tablet Take 1 tablet (10 mg total) by mouth 3 (three) times daily as needed. 30 tablet 0  . levocetirizine (XYZAL) 5 MG tablet levocetirizine 5 mg tablet    . permethrin (ELIMITE) 5 % cream Apply 1 application topically once for 1 dose. Apply from the neck down as directed; leave on for 8-14 hours 60 g 0   No current facility-administered medications for this visit.    Allergies: Cymbalta [duloxetine hcl], Flagyl [metronidazole], Belsomra [suvorexant], Codeine, Keflex [cephalexin], Morphine, and Penicillins  Past Medical History:  Diagnosis Date  . Anxiety   . Depression   . Endometriosis   . IBS (irritable bowel syndrome)     Past Surgical History:  Procedure  Laterality Date  . CHOLECYSTECTOMY    . nasal bone spur removed    . VAGINAL HYSTERECTOMY      Family History  Problem Relation Age of Onset  . Hypertension Mother   . Goiter Mother   . Heart disease Father   . Cancer Neg Hx   . Alcohol abuse Neg Hx   . Stroke Neg Hx     Social History   Tobacco Use  . Smoking status: Former Smoker    Packs/day: 0.25    Quit date: 05/12/2008    Years since quitting: 11.4  . Smokeless tobacco: Never Used  Substance Use Topics  . Alcohol use: Yes    Alcohol/week: 2.0 standard drinks    Types: 2 Glasses of wine per week    Subjective:  Rash on hands x 2 weeks; notes that palmar surfaces/ in between fingers are affected; notes that itching is keeping her awake at night; did stay at a hotel 2 weeks ago and is concerned about possible scabies; mentions that when she saw sports medicine recently, they thought it might be worth checking for auto-immune deficiency due to joint pain/ history of skin reactions; dermatologist is unable to see her today; does not tolerate oral prednisone well;    Objective:  Vitals:   10/18/19 0943  BP: 106/72  Pulse: (!) 110  Temp: 98.8 F (37.1 C)  TempSrc: Oral  SpO2: 99%  Weight: 139 lb 6.4 oz (63.2 kg)  Height: 5' 4"  (  1.626 m)    General: Well developed, well nourished, in no acute distress  Skin : Warm and dry. Raised lesions noted between fingertips bilaterally; mild erythema noted on palmar surface Head: Normocephalic and atraumatic  Lungs: Respirations unlabored;  Neurologic: Alert and oriented; speech intact; face symmetrical; moves all extremities well; CNII-XII intact without focal deficit   Assessment:  1. Rash of both hands   2. Myalgia     Plan:  Will go ahead and treat for scabies due to presentation of lesions between fingers and recent hospital stay; use Permethicin as prescribed; Rx for Hydroxyzine to take at night due to itching; Rx for Pepcid 20 mg bid;  Am more concerned for psoriasis or  auto-immune issue however; will update labs and have asked patient to call her dermatologist to see if she can be seen this week- ? If areas on hands need biopsy if they don't respond to scabies treatment.  This visit occurred during the SARS-CoV-2 public health emergency.  Safety protocols were in place, including screening questions prior to the visit, additional usage of staff PPE, and extensive cleaning of exam room while observing appropriate contact time as indicated for disinfecting solutions.     No follow-ups on file.  Orders Placed This Encounter  Procedures  . Antinuclear Antib (ANA)  . Sed Rate (ESR)  . Rheumatoid Factor    Requested Prescriptions   Signed Prescriptions Disp Refills  . famotidine (PEPCID) 20 MG tablet 60 tablet 0    Sig: Take 1 tablet (20 mg total) by mouth 2 (two) times daily.  . hydrOXYzine (ATARAX/VISTARIL) 10 MG tablet 30 tablet 0    Sig: Take 1 tablet (10 mg total) by mouth 3 (three) times daily as needed.  . permethrin (ELIMITE) 5 % cream 60 g 0    Sig: Apply 1 application topically once for 1 dose. Apply from the neck down as directed; leave on for 8-14 hours

## 2019-10-19 ENCOUNTER — Encounter: Payer: Self-pay | Admitting: Internal Medicine

## 2019-10-19 ENCOUNTER — Other Ambulatory Visit: Payer: Self-pay

## 2019-10-19 ENCOUNTER — Ambulatory Visit (INDEPENDENT_AMBULATORY_CARE_PROVIDER_SITE_OTHER): Payer: No Typology Code available for payment source | Admitting: Internal Medicine

## 2019-10-19 ENCOUNTER — Encounter: Payer: Self-pay | Admitting: Family

## 2019-10-19 VITALS — BP 144/64 | HR 89 | Temp 98.4°F | Resp 16 | Ht 64.0 in | Wt 137.0 lb

## 2019-10-19 DIAGNOSIS — L309 Dermatitis, unspecified: Secondary | ICD-10-CM | POA: Diagnosis not present

## 2019-10-19 MED ORDER — FLUOCINONIDE 0.05 % EX CREA: 1.0000 "application " | TOPICAL_CREAM | Freq: Two times a day (BID) | CUTANEOUS | 1 refills | Status: DC

## 2019-10-19 MED ORDER — HYDROXYZINE HCL 25 MG PO TABS: 25.0000 mg | ORAL_TABLET | Freq: Three times a day (TID) | ORAL | 0 refills | Status: DC | PRN

## 2019-10-19 MED FILL — FLUOCINONIDE 0.05% CREAM: 0.05 | 30 days supply | Qty: 60 | Fill #0

## 2019-10-19 MED FILL — hydrOXYzine HCL 25 MG TABS: 25 | 30 days supply | Qty: 90 | Fill #0

## 2019-10-19 NOTE — Patient Instructions (Signed)
Dyshidrotic Eczema Dyshidrotic eczema (pompholyx) is a type of eczema that causes very itchy (pruritic), fluid-filled blisters (vesicles) to form on the hands and feet. It can affect people of any age, but is more common before the age of 40. There is no cure, but treatment and certain lifestyle changes can help relieve symptoms. What are the causes? The cause of this condition is not known. What increases the risk? You are more likely to develop this condition if:  You wash your hands frequently.  You have a personal history or family history of eczema, allergies, asthma, or hay fever.  You are allergic to metals such as nickel or cobalt.  You work with cement.  You smoke. What are the signs or symptoms? Symptoms of this condition may affect the hands, feet, or both. Symptoms may come and go (recur), and may include:  Severe itching, which may happen before blisters appear.  Blisters. These may form suddenly. ? In the early stages, blisters may form near the fingertips. ? In severe cases, blisters may grow to large blister masses (bullae). ? Blisters resolve in 2-3 weeks without bursting. This is followed by a dry phase in which itching eases.  Pain and swelling.  Cracks or long, narrow openings (fissures) in the skin.  Severe dryness.  Ridges on the nails. How is this diagnosed? This condition may be diagnosed based on:  A physical exam.  Your symptoms.  Your medical history.  Skin scrapings to rule out a fungal infection.  Testing a swab of fluid for bacteria (culture).  Removing and checking a small piece of skin (biopsy) in order to test for infection or to rule out other conditions.  Skin patch tests. These tests involve taking patches that contain possible allergens and placing them on your back. Your health care provider will wait a few days and then check to see if an allergic reaction occurred. These tests may be done if your health care provider suspects  allergic reactions, or to rule out other types of eczema. You may be referred to a health care provider who specializes in the skin (dermatologist) to help diagnose and treat this condition. How is this treated? There is no cure for this condition, but treatment can help relieve symptoms. Depending on how many blisters you have and how severe they are, your health care provider may suggest:  Avoiding allergens, irritants, or triggers that worsen symptoms. This may involve lifestyle changes such as: ? Using different lotions or soaps. ? Avoiding hot weather or places that will cause you to sweat a lot. ? Managing stress with coping techniques such as relaxation and exercise, and asking for help when you need it. ? Diet changes as recommended by your health care provider.  Using a clean, damp towel (cool compress) to relieve symptoms.  Soaking in a bath that contains a type of salt that relieves irritation (aluminum acetate soaks).  Medicine taken by mouth to reduce itching (oral antihistamines).  Medicine applied to the skin to reduce swelling and irritation (topical corticosteroids).  Medicine that reduces the activity of the body's disease-fighting system (immunosuppressants) to treat inflammation. This may be given in severe cases.  Antibiotic medicines to treat bacterial infection.  Light therapy (phototherapy). This involves shining ultraviolet (UV) light on affected skin in order to reduce itchiness and inflammation. Follow these instructions at home: Bathing and skin care   Wash skin gently. After bathing or washing your hands, pat your skin dry. Avoid rubbing your skin.  Remove   all jewelry before bathing. If the skin under the jewelry stays wet, blisters may form or get worse.  Apply cool compresses as told by your health care provider: ? Soak a clean towel in cool water. ? Wring out excess water until towel is damp. ? Place the towel over affected skin. Leave the towel on  for 20 minutes at a time, 2-3 times a day.  Use mild soaps, cleansers, and lotions that do not contain dyes, perfumes, or other irritants.  Keep your skin hydrated. To do this: ? Avoid very hot water. Take lukewarm baths or showers. ? Apply moisturizer within three minutes of bathing. This locks in moisture. Medicines  Take and apply over-the-counter and prescription medicines only as told by your health care provider.  If you were prescribed antibiotic medicine, take or apply it as told by your health care provider. Do not stop using the antibiotic even if you start to feel better. General instructions  Identify and avoid triggers and allergens.  Keep fingernails short to avoid breaking open the skin while scratching.  Use waterproof gloves to protect your hands when doing work that keeps your hands wet for a long time.  Wear socks to keep your feet dry.  Do not use any products that contain nicotine or tobacco, such as cigarettes and e-cigarettes. If you need help quitting, ask your health care provider.  Keep all follow-up visits as told by your health care provider. This is important. Contact a health care provider if:  You have symptoms that do not go away.  You have signs of infection, such as: ? Crusting, pus, or a bad smell. ? More redness, swelling, or pain. ? Increased warmth in the affected area. Summary  Dyshidrotic eczema (pompholyx) is a type of eczema that causes very itchy (pruritic), fluid-filled blisters (vesicles) to form on the hands and feet.  The cause of this condition is not known.  There is no cure for this condition, but treatment can help relieve symptoms. Treatment depends on how many blisters you have and how severe they are.  Use mild soaps, cleansers, and lotions that do not contain dyes, perfumes, or other irritants. Keep your skin hydrated. This information is not intended to replace advice given to you by your health care provider. Make sure  you discuss any questions you have with your health care provider. Document Revised: 10/14/2018 Document Reviewed: 11/07/2016 Elsevier Patient Education  2020 Elsevier Inc.  

## 2019-10-19 NOTE — Progress Notes (Signed)
Subjective:  Patient ID: Janice Spencer, female    DOB: 1966/05/03  Age: 54 y.o. MRN: [id number]  CC: Rash  This visit occurred during the SARS-CoV-2 public health emergency.  Safety protocols were in place, including screening questions prior to the visit, additional usage of staff PPE, and extensive cleaning of exam room while observing appropriate contact time as indicated for disinfecting solutions.    HPI Janice Spencer presents for concerns about a rash on both hands.  This has been present for about 2 or 3 days.  She saw someone else 2 days ago and the thought was that this might be scabies or hand eczema.  She took a course of permethrin which did not help.  She has also not gotten much relief from famotidine or Xyzal.  She has gotten some relief from hydroxyzine but she has to take 2 of the 10 mg tablets to get any relief.  The rash is made worse by heat.  It is better with aloe vera cream, cold water, and over-the-counter hydrocortisone cream.  2 weeks ago she was in [place] and developed a papular, red, itchy rash on her lower extremities from sun exposure but that has resolved.  Outpatient Medications Prior to Visit  Medication Sig Dispense Refill  . amphetamine-dextroamphetamine (ADDERALL) 10 MG tablet Take 10 mg by mouth daily with breakfast.    . clonazePAM (KLONOPIN) 1 MG tablet Take 0.5-1 mg by mouth at bedtime as needed.    Marland Kitchen estradiol (VIVELLE-DOT) 0.05 MG/24HR patch estradiol 0.05 mg/24 hr semiweekly transdermal patch    . famotidine (PEPCID) 20 MG tablet Take 1 tablet (20 mg total) by mouth 2 (two) times daily. 60 tablet 0  . Naltrexone HCl POWD Take 5 mg by mouth daily. 4.5 g 1  . nortriptyline (PAMELOR) 25 MG capsule Take 2 capsules (50 mg total) by mouth at bedtime. 180 capsule 1  . progesterone (PROMETRIUM) 100 MG capsule     . hydrOXYzine (ATARAX/VISTARIL) 10 MG tablet Take 1 tablet (10 mg total) by mouth 3 (three) times daily as needed. 30 tablet 0  .  levocetirizine (XYZAL) 5 MG tablet levocetirizine 5 mg tablet    . permethrin (ELIMITE) 5 % cream      No facility-administered medications prior to visit.    ROS Review of Systems  Constitutional: Negative.  Negative for chills, diaphoresis, fatigue and fever.  HENT: Negative.   Eyes: Negative.  Negative for redness and itching.  Respiratory: Negative for cough, chest tightness, shortness of breath and wheezing.   Cardiovascular: Negative for chest pain, palpitations and leg swelling.  Gastrointestinal: Negative for abdominal pain, constipation, diarrhea, nausea and vomiting.  Endocrine: Negative.   Genitourinary: Negative.  Negative for difficulty urinating.  Musculoskeletal: Negative.   Skin: Positive for color change and rash.  Neurological: Negative.  Negative for dizziness and weakness.  Hematological: Negative for adenopathy. Does not bruise/bleed easily.  Psychiatric/Behavioral: Negative.     Objective:  BP (!) 144/64 (BP Location: Left Arm, Patient Position: Sitting, Cuff Size: Normal)   Pulse 89   Temp 98.4 F (36.9 C) (Oral)   Resp 16   Ht 5\' 4"  (1.626 m)   Wt 137 lb (62.1 kg)   SpO2 99%   BMI 23.52 kg/m   BP Readings from Last 3 Encounters:  10/19/19 (!) 144/64  10/18/19 106/72  09/06/19 112/66    Wt Readings from Last 3 Encounters:  10/19/19 137 lb (62.1 kg)  10/18/19 139  lb 6.4 oz (63.2 kg)  09/06/19 136 lb 12.8 oz (62.1 kg)    Physical Exam Vitals reviewed.  Skin:    Findings: Rash present. No abrasion, abscess, ecchymosis, lesion, petechiae or wound. Rash is not crusting, macular, nodular, papular, pustular, scaling or urticarial.     Comments: Both hands shows diffuse xerosis, mild erythema, and thickening of the skin but there are no discrete epidermal lesions.  See photos.     Lab Results  Component Value Date   WBC 5.1 04/01/2017   HGB 14.0 04/01/2017   HCT 42.0 04/01/2017   PLT 233.0 04/01/2017   GLUCOSE 117 (H) 05/13/2019   CHOL 218  (H) 05/13/2019   TRIG 74.0 05/13/2019   HDL 67.30 05/13/2019   LDLCALC 136 (H) 05/13/2019   ALT 9 04/01/2017   AST 14 04/01/2017   NA 141 05/13/2019   K 4.0 05/13/2019   CL 105 05/13/2019   CREATININE 0.81 05/13/2019   BUN 13 05/13/2019   CO2 28 05/13/2019   TSH 1.11 05/13/2019   HGBA1C 5.6 12/13/2015    No results found.  Assessment & Plan:   Ronise was seen today for rash.  Diagnoses and all orders for this visit:  Acute hand eczema- I have asked her to moisturize her hands with something like Aquaphor and to avoid all the other topical agents.  Will treat with a mid potency prescription steroid.  Will increase the dose of hydroxyzine. -     fluocinonide cream (LIDEX) 0.05 %; Apply 1 application topically 2 (two) times daily. -     hydrOXYzine (ATARAX/VISTARIL) 25 MG tablet; Take 1 tablet (25 mg total) by mouth every 8 (eight) hours as needed.   I have discontinued Hanalei Mockus. [place] "Pam"'s levocetirizine, hydrOXYzine, and permethrin. I am also having her start on fluocinonide cream and hydrOXYzine. Additionally, I am having her maintain her amphetamine-dextroamphetamine, nortriptyline, Naltrexone HCl, progesterone, clonazePAM, estradiol, and famotidine.  Meds ordered this encounter  Medications  . fluocinonide cream (LIDEX) 0.05 %    Sig: Apply 1 application topically 2 (two) times daily.    Dispense:  60 g    Refill:  1  . hydrOXYzine (ATARAX/VISTARIL) 25 MG tablet    Sig: Take 1 tablet (25 mg total) by mouth every 8 (eight) hours as needed.    Dispense:  90 tablet    Refill:  0     Follow-up: Return if symptoms worsen or fail to improve.  Scarlette Calico, MD

## 2019-10-21 LAB — RHEUMATOID FACTOR: Rheumatoid fact SerPl-aCnc: <14 IU/mL (ref <14)

## 2019-10-21 LAB — ANA: Anti Nuclear Antibody (ANA): NEGATIVE

## 2020-01-03 MED FILL — AMPHETAMINE SALTS 10 MG: 10 | 30 days supply | Qty: 60 | Fill #0

## 2020-01-09 MED FILL — clonazePAM 1 MG TABS: 1 | 90 days supply | Qty: 90 | Fill #0

## 2020-02-25 ENCOUNTER — Encounter: Payer: Self-pay | Admitting: Internal Medicine

## 2020-02-26 ENCOUNTER — Emergency Department
Admission: EM | Admit: 2020-02-26 | Discharge: 2020-02-26 | Disposition: A | Discharge status: Home / Self Care | Payer: No Typology Code available for payment source | Source: Home / Self Care

## 2020-02-26 ENCOUNTER — Encounter: Payer: Self-pay | Admitting: *Deleted

## 2020-02-26 ENCOUNTER — Other Ambulatory Visit: Payer: Self-pay

## 2020-02-26 DIAGNOSIS — L298 Other pruritus: Secondary | ICD-10-CM | POA: Diagnosis not present

## 2020-02-26 MED ORDER — DEXAMETHASONE SODIUM PHOSPHATE 10 MG/ML IJ SOLN
10.0000 mg | Freq: Once | INTRAMUSCULAR | Status: AC
  Administered 2020-02-26: 10 mg via INTRAMUSCULAR

## 2020-02-26 MED ORDER — PREDNISONE 20 MG PO TABS: 20.0000 mg | ORAL_TABLET | Freq: Two times a day (BID) | ORAL | 0 refills | Status: AC

## 2020-02-26 NOTE — ED Provider Notes (Signed)
[facility]    CSN: [id number] Arrival date & time: 02/26/20  0957      History   Chief Complaint Chief Complaint  Patient presents with  . Insect Bite    HPI Janice Spencer is a 54 y.o. female.   HPI  Janice Spencer is a 54 y.o. female presenting to UC with c/o red itchy rash that started as an insect bite last weekend on her Left foot 2nd toe.  She has developed another red itchy rash on posterior upper calf and blisters on her left middle finger c/o prior eczema.  She has used previously prescribed fluocinonide cream for similar rash and eczema last year without much relief. Denies fever or chills.  She is concerned she may have an underlying autoimmune disease. She has not f/u with PCP for current rash.     Past Medical History:  Diagnosis Date  . Anxiety   . Depression   . Endometriosis   . IBS (irritable bowel syndrome)     Patient Active Problem List   Diagnosis Date Noted  . Acute hand eczema 10/19/2019  . Primary osteoarthritis of left knee 09/06/2019  . Cervical radiculopathy 04/02/2018  . Hypocalcemia 12/20/2015  . 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 Routine general medical examination at a health care facility 07/18/2014  . Fibromyalgia muscle pain 10/18/2011  . Regional enteritis [facility]) 09/13/2004    Past Surgical History:  Procedure Laterality Date  . CHOLECYSTECTOMY    . nasal bone spur removed    . VAGINAL HYSTERECTOMY      OB History   No obstetric history on file.      Home Medications    Prior to Admission medications   Medication Sig Start Date End Date Taking? Authorizing Provider  amphetamine-dextroamphetamine (ADDERALL) 10 MG tablet Take 10 mg by mouth daily with breakfast.    [provider]  clonazePAM (KLONOPIN) 1 MG tablet Take 0.5-1 mg by mouth at bedtime as needed. 10/14/19   [provider]  estradiol (VIVELLE-DOT) 0.05 MG/24HR patch estradiol 0.05 mg/24 hr semiweekly transdermal  patch    [provider]  famotidine (PEPCID) 20 MG tablet Take 1 tablet (20 mg total) by mouth 2 (two) times daily. 10/18/19   Marrian Salvage, FNP  fluocinonide cream (LIDEX) 5.85 % Apply 1 application topically 2 (two) times daily. 10/19/19   Janith Lima, MD  hydrOXYzine (ATARAX/VISTARIL) 25 MG tablet Take 1 tablet (25 mg total) by mouth every 8 (eight) hours as needed. 10/19/19   Janith Lima, MD  Naltrexone HCl POWD Take 5 mg by mouth daily. 09/16/19   Janith Lima, MD  nortriptyline (PAMELOR) 25 MG capsule Take 2 capsules (50 mg total) by mouth at bedtime. 06/08/19   Janith Lima, MD  predniSONE (DELTASONE) 20 MG tablet Take 1 tablet (20 mg total) by mouth 2 (two) times daily with a meal for 5 days. 02/26/20 03/02/20  Noe Gens, PA-C  progesterone (PROMETRIUM) 100 MG capsule  09/02/19   [provider]    Family History Family History  Problem Relation Age of Onset  . Hypertension Mother   . Goiter Mother   . Heart disease Father   . Cancer Neg Hx   . Alcohol abuse Neg Hx   . Stroke Neg Hx     Social History Social History   Tobacco Use  . Smoking status: Former Smoker    Packs/day: 0.25    Quit date:  05/12/2008    Years since quitting: 11.8  . Smokeless tobacco: Never Used  Substance Use Topics  . Alcohol use: Yes    Alcohol/week: 2.0 standard drinks    Types: 2 Glasses of wine per week  . Drug use: No     Allergies   Cymbalta [duloxetine hcl], Flagyl [metronidazole], Belsomra [suvorexant], Codeine, Keflex [cephalexin], Morphine, and Penicillins   Review of Systems Review of Systems  Constitutional: Negative for chills and fever.  Musculoskeletal: Negative for arthralgias, joint swelling and myalgias.  Skin: Positive for color change and rash.     Physical Exam Triage Vital Signs ED Triage Vitals  Enc Vitals Group     BP 02/26/20 1101 113/71     Pulse Rate 02/26/20 1101 93     Resp 02/26/20 1101 16     Temp 02/26/20  1101 98.1 F (36.7 C)     Temp Source 02/26/20 1101 Oral     SpO2 02/26/20 1101 100 %     Weight --      Height --      Head Circumference --      Peak Flow --      Pain Score 02/26/20 1057 0     Pain Loc --      Pain Edu? --      Excl. in [place]? --    No data found.  Updated Vital Signs BP 113/71 (BP Location: Right Arm)   Pulse 93   Temp 98.1 F (36.7 C) (Oral)   Resp 16   SpO2 100%   Visual Acuity Right Eye Distance:   Left Eye Distance:   Bilateral Distance:    Right Eye Near:   Left Eye Near:    Bilateral Near:     Physical Exam Vitals and nursing note reviewed.  Constitutional:      Appearance: Normal appearance. She is well-developed.  HENT:     Head: Normocephalic and atraumatic.  Cardiovascular:     Rate and Rhythm: Normal rate.  Pulmonary:     Effort: Pulmonary effort is normal.  Musculoskeletal:        General: Normal range of motion.     Cervical back: Normal range of motion.  Skin:    General: Skin is warm and dry.     Findings: Erythema and rash present.          Comments: Erythematous macular papular dried rash on foot and leg. Rash does blanch. Non-tender. 14mm erythematous vesicle on Left middle finger. Non-tender.  Neurological:     Mental Status: She is alert and oriented to person, place, and time.  Psychiatric:        Behavior: Behavior normal.      UC Treatments / Results  Labs (all labs ordered are listed, but only abnormal results are displayed) Labs Reviewed - No data to display  EKG   Radiology No results found.  Procedures Procedures (including critical care time)  Medications Ordered in UC Medications  dexamethasone (DECADRON) injection 10 mg (10 mg Intramuscular Given 02/26/20 1155)    Initial Impression / Assessment and Plan / UC Course  I have reviewed the triage vital signs and the nursing notes.  Pertinent labs & imaging results that were available during my care of the patient were reviewed by me and  considered in my medical decision making (see chart for details).    Will start pt on oral prednisone Pt given decadron 10mg  IM in UC Encouraged f/u with PCP, may need referral  to specialists for additional workup AVS given  Final Clinical Impressions(s) / UC Diagnoses   Final diagnoses:  Pruritic erythematous rash     Discharge Instructions      Please call to schedule a follow up appointment with your primary care provider later this week if not improving.  You may benefit from a referral to a different dermatology office, allergist, and/or other specialist for ongoing treatment and prevention of similar rash and eczema.      ED Prescriptions    Medication Sig Dispense Auth. Provider   predniSONE (DELTASONE) 20 MG tablet Take 1 tablet (20 mg total) by mouth 2 (two) times daily with a meal for 5 days. 11 tablet Noe Gens, [place]     PDMP not reviewed this encounter.   Noe Gens, [place] 02/27/20 1459

## 2020-02-26 NOTE — Discharge Instructions (Signed)
  Please call to schedule a follow up appointment with your primary care provider later this week if not improving.  You may benefit from a referral to a different dermatology office, allergist, and/or other specialist for ongoing treatment and prevention of similar rash and eczema.

## 2020-02-26 NOTE — ED Triage Notes (Signed)
Patient reports being bite last weekend (left foot 2nd toe and left anterior foot). Mild rash noted.   Sunday evening started having rash posterior upper calf.   Also has blisters noted to left had, middle finger, states hx of eczema.   Reports severe itching.

## 2020-02-28 ENCOUNTER — Encounter: Payer: Self-pay | Admitting: Family

## 2020-02-28 ENCOUNTER — Ambulatory Visit (INDEPENDENT_AMBULATORY_CARE_PROVIDER_SITE_OTHER): Payer: No Typology Code available for payment source | Admitting: Family

## 2020-02-28 ENCOUNTER — Other Ambulatory Visit: Payer: Self-pay

## 2020-02-28 ENCOUNTER — Other Ambulatory Visit: Payer: Self-pay | Admitting: Family

## 2020-02-28 VITALS — BP 112/78 | HR 95 | Temp 98.7°F | Wt 142.0 lb

## 2020-02-28 DIAGNOSIS — L309 Dermatitis, unspecified: Secondary | ICD-10-CM

## 2020-02-28 MED ORDER — FLUOCINONIDE 0.05 % EX CREA: 1.0000 "application " | TOPICAL_CREAM | Freq: Two times a day (BID) | CUTANEOUS | 1 refills | Status: DC

## 2020-02-28 MED ORDER — EPINEPHRINE 0.3 MG/0.3ML IJ SOAJ: 0.3000 mg | INTRAMUSCULAR | 1 refills | Status: DC | PRN

## 2020-02-28 MED FILL — FLUOCINONIDE 0.05% CREAM: 0.05 | 30 days supply | Qty: 60 | Fill #0

## 2020-02-28 MED FILL — EPINEPHRINE 0.3 MG AUTO-INJ: 0.3 | 2 days supply | Qty: 2 | Fill #0

## 2020-02-28 NOTE — Progress Notes (Signed)
Janice Spencer is a 54 y.o. female with the following history as recorded in [employer]:  Patient Active Problem List   Diagnosis Date Noted  . Acute hand eczema 10/19/2019  . Primary osteoarthritis of left knee 09/06/2019  . Cervical radiculopathy 04/02/2018  . Hypocalcemia 12/20/2015  . 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 Routine general medical examination at a health care facility 07/18/2014  . Fibromyalgia muscle pain 10/18/2011  . Regional enteritis [facility]) 09/13/2004    Current Outpatient Medications  Medication Sig Dispense Refill  . amphetamine-dextroamphetamine (ADDERALL) 10 MG tablet Take 10 mg by mouth daily with breakfast.    . clonazePAM (KLONOPIN) 1 MG tablet Take 0.5-1 mg by mouth at bedtime as needed.    Marland Kitchen estradiol (VIVELLE-DOT) 0.05 MG/24HR patch estradiol 0.05 mg/24 hr semiweekly transdermal patch    . fluocinonide cream (LIDEX) 9.37 % Apply 1 application topically 2 (two) times daily. 60 g 1  . hydrOXYzine (ATARAX/VISTARIL) 25 MG tablet Take 1 tablet (25 mg total) by mouth every 8 (eight) hours as needed. 90 tablet 0  . Naltrexone HCl POWD Take 5 mg by mouth daily. 4.5 g 1  . nortriptyline (PAMELOR) 25 MG capsule Take 2 capsules (50 mg total) by mouth at bedtime. 180 capsule 1  . predniSONE (DELTASONE) 20 MG tablet Take 1 tablet (20 mg total) by mouth 2 (two) times daily with a meal for 5 days. 11 tablet 0   No current facility-administered medications for this visit.    Allergies: Cymbalta [duloxetine hcl], Flagyl [metronidazole], Belsomra [suvorexant], Codeine, Keflex [cephalexin], Morphine, Penicillins, and Permethrin  Past Medical History:  Diagnosis Date  . Anxiety   . Depression   . Endometriosis   . IBS (irritable bowel syndrome)     Past Surgical History:  Procedure Laterality Date  . CHOLECYSTECTOMY    . nasal bone spur removed    . VAGINAL HYSTERECTOMY      Family History  Problem Relation Age of Onset  . Hypertension Mother   .  Goiter Mother   . Heart disease Father   . Cancer Neg Hx   . Alcohol abuse Neg Hx   . Stroke Neg Hx     Social History   Tobacco Use  . Smoking status: Former Smoker    Packs/day: 0.25    Quit date: 05/12/2008    Years since quitting: 11.8  . Smokeless tobacco: Never Used  Substance Use Topics  . Alcohol use: Yes    Alcohol/week: 2.0 standard drinks    Types: 2 Glasses of wine per week    Subjective:  Patient was seen at the ER on Saturday with recurrence of eczema type rash; seemed to be related to recent insect bites- ? Trigger; also notes that she recently tried a new form of progesterone and wonders if this could be the cause; similar type rash occurred earlier this year; has not been able to see her dermatologist- requesting updated referral; Is responding to the treatment given at [facility] on Saturday;   Objective:  Vitals:   02/28/20 1128  BP: 112/78  Pulse: 95  Temp: 98.7 F (37.1 C)  TempSrc: Oral  SpO2: 99%  Weight: 142 lb (64.4 kg)    General: Well developed, well nourished, in no acute distress  Skin : Warm and dry. Eczema rash on inner right thigh Head: Normocephalic and atraumatic  Lungs: Respirations unlabored;  Neurologic: Alert and oriented; speech intact; face symmetrical; moves all extremities well; CNII-XII intact without focal deficit  Assessment:  1. Eczema, unspecified type   2. Acute hand eczema     Plan:  Refer to dermatology- areas need to be biopsied; patient is concerned about auto-immune issue but testing was normal earlier this year; also recommended that she discuss alternative progesterone option with her GYN- she understands she can't take unopposed estrogen; Refill updated on Lidex as requested;  This visit occurred during the SARS-CoV-2 public health emergency.  Safety protocols were in place, including screening questions prior to the visit, additional usage of staff PPE, and extensive cleaning of exam room while observing appropriate  contact time as indicated for disinfecting solutions.     No follow-ups on file.  Orders Placed This Encounter  Procedures  . Ambulatory referral to Dermatology    Referral Priority:   Routine    Referral Type:   Consultation    Referral Reason:   Specialty Services Required    Requested Specialty:   Dermatology    Number of Visits Requested:   1  . Ambulatory referral to Allergy    Referral Priority:   Routine    Referral Type:   Allergy Testing    Referral Reason:   Specialty Services Required    Requested Specialty:   Allergy    Number of Visits Requested:   1    Requested Prescriptions   Signed Prescriptions Disp Refills  . fluocinonide cream (LIDEX) 0.05 % 60 g 1    Sig: Apply 1 application topically 2 (two) times daily.

## 2020-03-07 ENCOUNTER — Encounter: Payer: Self-pay | Admitting: Family

## 2020-03-08 ENCOUNTER — Other Ambulatory Visit: Payer: Self-pay | Admitting: Family

## 2020-03-08 DIAGNOSIS — R21 Rash and other nonspecific skin eruption: Secondary | ICD-10-CM

## 2020-03-10 MED FILL — EPINEPHRINE 0.3 MG AUTO-INJ: 0.3 | 2 days supply | Qty: 2 | Fill #0

## 2020-03-16 NOTE — Progress Notes (Signed)
New Patient Note  RE: Janice Spencer MRN: [id number] DOB: [birth year] Date of Office Visit: 03/17/2020  Referring provider: Janith Lima, MD Primary care provider: Janith Lima, MD  Chief Complaint: Allergic Reaction and Eczema  History of Present Illness: I had the pleasure of seeing Janice Spencer for initial evaluation at the [facility] [facility] [place] on 03/19/2020. She is a 54 y.o. female, who is referred here by Janith Lima, MD for the evaluation of rash.   Patient always had issues with the sun and breaks out in itchy rash with sun exposure. She usually pretreats with zyrtec and sunscreen which used to help but not this year.   In August 2020 patient developed a rash on her legs which turned into cellulitis. This started after she was sitting in a hammock and patient was concerned about poison ivy. She was evaluated by dermatology who did not think it was poison ivy.  She was treated with prednisone and antibiotics. Rash took about 6 weeks to resolve.  In March 2021 she had dyshidrotic eczema on her hands. She was using hydroxyzine with no benefit. Initially she was treated with premathrin due to concern for scabies but then woke up at night with facial/lip swelling after applying the permethrin cream.   On August 14th, 2021 patient was on a boat. She got bit on her toe by a black ant and broke out in a large rash. This somewhat cleared up after a steroid injection and oral prednisone course.  Patient sees a Paediatric nurse PA in [place]. No prior patch testing or skin biopsy.   Patient concerned if the progesterone pills she was taking may be contributing to these episodes.    Denies any fevers, chills, changes in medications, foods, personal care products or recent infections. Currently uses Eucerin lotion but sometimes she uses Shea butter.   10/18/2019 - ANA was negative.   Assessment and Plan: Janice Spencer is a 54 y.o. female with: Pruritic rash History of  breaking out in pruritic rash with multiple potential triggers including sun exposure, poison ivy, and insects. Follows with a dermatology physician assistant. No prior patch testing or skin biopsy. Treated with systemic steroids and oral antihistamines. . Discussed with patient that not sure what's causing or triggering these rashes - or if there's even a trigger.  Marland Kitchen Keep track of flares and take pictures.  . Get bloodwork as below to rule out other etiologies.   Take zyrtec (cetrizine) 10mg  daily to help with the rash/itching. May take twice a day if needed.   See below for proper skin care - only use fragrance free and dye free products.   May use Eucrisa twice a day as needed for the hands.  May use triamcinolone 0.1% ointment twice a day as needed for rash flares. Do not use on the face, neck, armpits or groin area. Do not use more than 3 weeks in a row.   May need biopsy of the rash if not improving by dermatology.   Recommend patch testing.  Prior to sun exposure:   Take zyrtec 10mg  before exposure and may take twice a day if needed.  Make sure to wear sunscreen.   Dyshidrotic eczema One episode of dyshidrotic eczema on the hands bilaterally.  May use Eucrisa twice a day as needed.  Adverse food reaction Patient concerned if the dye in certain medications triggering these rashes. History of oral angioedema with kiwi.  Continue to avoid kiwi.   Will get  kiwi IgE and check for red/yellow dye IgE via bloodwork.   For mild symptoms you can take over the counter antihistamines such as Benadryl and monitor symptoms closely. If symptoms worsen or if you have severe symptoms including breathing issues, throat closure, significant swelling, whole body hives, severe diarrhea and vomiting, lightheadedness then seek immediate medical care.  Bite, insect  Will check hymenoptera panel and fire ant via bloodwork.  Other allergic rhinitis Mild rhinitis symptoms and takes OTC  antihistamines with good benefit. Skin testing in 2008 showed multiple positives per patient report.   Monitor symptoms.   May use over the counter antihistamines such as Zyrtec (cetirizine), Claritin (loratadine), Allegra (fexofenadine), or Xyzal (levocetirizine) daily as needed.  If symptoms worsen recommend re-testing.   Return in about 3 months (around 06/16/2020).  Meds ordered this encounter  Medications  . Crisaborole (EUCRISA) 2 % OINT    Sig: May use Eucrisa twice a day for dyshidrotic eczema on hands if needed.    Dispense:  100 g    Refill:  1  . triamcinolone ointment (KENALOG) 0.1 %    Sig: Apply 1 application topically 2 (two) times daily as needed. Do not use on the face, neck, armpits or groin area. Do not use more than 3 weeks in a row.    Dispense:  80 g    Refill:  1    Lab Orders     CBC with Differential/Platelet     Allergen Fire Ant     Alpha-Gal Panel     Chronic Urticaria     C-reactive protein     Sedimentation rate     Tryptase     Thyroid Cascade Profile     Comprehensive metabolic panel     ALLERGEN, ANNATTO SEED     Allergen, Red (Carmine) Dye, [id number]     Allergen, Kiwi Fruit, f84     Allergen Hymenoptera Panel  Other allergy screening: Asthma: no Rhino conjunctivitis: yes  Some mild rhinitis symptoms and takes OTC antihistamines with good benefit. Had skin testing in 2008 by other allergist which showed multiple positives per patient report.  Food allergy: yes  Kiwi causes oral angioedema.  Medication allergy: yes Hymenoptera allergy: no Urticaria: no Eczema:No History of recurrent infections suggestive of immunodeficency: no  Diagnostics: None.  Past Medical History: Patient Active Problem List   Diagnosis Date Noted  . Pruritic rash 03/19/2020  . Dyshidrotic eczema 03/19/2020  . Bite, insect 03/19/2020  . Adverse food reaction 03/19/2020  . Other allergic rhinitis 03/19/2020  . Acute hand eczema 10/19/2019  . Primary  osteoarthritis of left knee 09/06/2019  . Cervical radiculopathy 04/02/2018  . Hypocalcemia 12/20/2015  . 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 Routine general medical examination at a health care facility 07/18/2014  . Fibromyalgia muscle pain 10/18/2011  . 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 Anxiety   . Depression   . Eczema   . Endometriosis   . Fibromyalgia 2012  . IBS (irritable bowel syndrome)   . Urticaria    Past Surgical History: Past Surgical History:  Procedure Laterality Date  . CHOLECYSTECTOMY    . nasal bone spur removed    . TONSILLECTOMY    . TYMPANOSTOMY TUBE PLACEMENT    . VAGINAL HYSTERECTOMY     Medication List:  Current Outpatient Medications  Medication Sig Dispense Refill  . amphetamine-dextroamphetamine (ADDERALL) 10 MG tablet Take 10 mg by mouth daily with  breakfast.    . Ascorbic Acid (VITAMIN C PO) Take 1,000 mg by mouth daily.    . clonazePAM (KLONOPIN) 1 MG tablet Take 0.5-1 mg by mouth at bedtime as needed.    Marland Kitchen EPINEPHrine 0.3 mg/0.3 mL IJ SOAJ injection Inject 0.3 mLs (0.3 mg total) into the muscle as needed for anaphylaxis. 2 each 1  . estradiol (VIVELLE-DOT) 0.05 MG/24HR patch estradiol 0.05 mg/24 hr semiweekly transdermal patch    . hydrOXYzine (ATARAX/VISTARIL) 25 MG tablet Take 1 tablet (25 mg total) by mouth every 8 (eight) hours as needed. 90 tablet 0  . Multiple Vitamins-Minerals (ZINC PO) Take 25 mg by mouth daily.    . Naltrexone HCl POWD Take 5 mg by mouth daily. 4.5 g 1  . nortriptyline (PAMELOR) 25 MG capsule Take 2 capsules (50 mg total) by mouth at bedtime. (Patient taking differently: Take 25 mg by mouth at bedtime. ) 180 capsule 1  . Crisaborole (EUCRISA) 2 % OINT May use Eucrisa twice a day for dyshidrotic eczema on hands if needed. 100 g 1  . fluocinonide cream (LIDEX) 5.63 % Apply 1 application topically 2 (two) times daily. (Patient not taking: Reported on 03/17/2020) 60 g  1  . triamcinolone ointment (KENALOG) 0.1 % Apply 1 application topically 2 (two) times daily as needed. Do not use on the face, neck, armpits or groin area. Do not use more than 3 weeks in a row. 80 g 1   No current facility-administered medications for this visit.   Allergies: Allergies  Allergen Reactions  . Cymbalta [Duloxetine Hcl]     syncope  . Flagyl [Metronidazole]     rash  . Belsomra [Suvorexant] Other (See Comments)    "sleep paralysis"  . Codeine   . Keflex [Cephalexin]   . Morphine   . Other   . Penicillins   . Permethrin     Lip swelling  . Erythromycin Base Rash   Social History: Social History   Socioeconomic History  . Marital status: Divorced    Spouse name: Not on file  . Number of children: Not on file  . Years of education: Not on file  . Highest education level: Not on file  Occupational History  . Not on file  Tobacco Use  . Smoking status: Former Smoker    Packs/day: 0.25    Quit date: 05/12/2008    Years since quitting: 11.8  . Smokeless tobacco: Never Used  Vaping Use  . Vaping Use: Never used  Substance and Sexual Activity  . Alcohol use: Yes    Alcohol/week: 2.0 standard drinks    Types: 2 Glasses of wine per week  . Drug use: No  . Sexual activity: Not Currently  Other Topics Concern  . Not on file  Social History Narrative  . Not on file   Social Determinants of Health   Financial Resource Strain:   . Difficulty of Paying Living Expenses: Not on file  Food Insecurity:   . Worried About Charity fundraiser in the Last Year: Not on file  . Ran Out of Food in the Last Year: Not on file  Transportation Needs:   . Lack of Transportation (Medical): Not on file  . Lack of Transportation (Non-Medical): Not on file  Physical Activity:   . Days of Exercise per Week: Not on file  . Minutes of Exercise per Session: Not on file  Stress:   . Feeling of Stress : Not on file  Social Connections:   .  Frequency of Communication with  Friends and Family: Not on file  . Frequency of Social Gatherings with Friends and Family: Not on file  . Attends Religious Services: Not on file  . Active Member of Clubs or Organizations: Not on file  . Attends Archivist Meetings: Not on file  . Marital Status: Not on file   Lives in a 1950s house. Smoking: quit in 2009 Occupation: RN  Environmental History: Water Damage/mildew in the house: yes Carpet in the family room: no Carpet in the bedroom: no Heating: oil Cooling: window Pet: yes 1 dog x 2 yrs  Family History: Family History  Problem Relation Age of Onset  . Hypertension Mother   . Goiter Mother   . Heart disease Father   . Allergic rhinitis Maternal Grandmother   . Angioedema Maternal Grandmother   . Cancer Neg Hx   . Alcohol abuse Neg Hx   . Stroke Neg Hx    Review of Systems  Constitutional: Negative for appetite change, chills, fever and unexpected weight change.  HENT: Negative for congestion and rhinorrhea.   Eyes: Negative for itching.  Respiratory: Negative for cough, chest tightness, shortness of breath and wheezing.   Cardiovascular: Negative for chest pain.  Gastrointestinal: Negative for abdominal pain.  Genitourinary: Negative for difficulty urinating.  Skin: Positive for rash.  Neurological: Negative for headaches.   Objective: BP 120/70   Pulse (!) 116   Temp 98.2 F (36.8 C) (Oral)   Resp 18   Ht 5' 3.39" (1.61 m)   Wt 141 lb 3.2 oz (64 kg)   SpO2 100%   BMI 24.71 kg/m  Body mass index is 24.71 kg/m. Physical Exam Vitals and nursing note reviewed.  Constitutional:      Appearance: Normal appearance. She is well-developed.  HENT:     Head: Normocephalic and atraumatic.     Right Ear: External ear normal.     Left Ear: External ear normal.     Ears:     Comments: Scarring on TM b/l.    Nose: Nose normal.     Mouth/Throat:     Mouth: Mucous membranes are moist.     Pharynx: Oropharynx is clear.  Eyes:      Conjunctiva/sclera: Conjunctivae normal.  Cardiovascular:     Rate and Rhythm: Normal rate and regular rhythm.     Heart sounds: Normal heart sounds. No murmur heard.  No friction rub. No gallop.   Pulmonary:     Effort: Pulmonary effort is normal.     Breath sounds: Normal breath sounds. No wheezing, rhonchi or rales.  Abdominal:     Palpations: Abdomen is soft.  Musculoskeletal:     Cervical back: Neck supple.  Skin:    General: Skin is warm and dry.     Findings: Rash present.     Comments: Tiny clusters of blistering on right lateral big toe. Dry, slight erythematous patches on right lower extremity near the medial knee and right medial ankle area.   Neurological:     Mental Status: She is alert and oriented to person, place, and time.  Psychiatric:        Behavior: Behavior normal.    The plan was reviewed with the patient/family, and all questions/concerned were addressed.  It was my pleasure to see Janice Spencer today and participate in her care. Please feel free to contact me with any questions or concerns.  Sincerely,  Rexene Alberts, DO Allergy & Immunology  [facility] [place]  [facility] office: [phone] [facility] office: [place] office: [phone]

## 2020-03-17 ENCOUNTER — Ambulatory Visit: Payer: No Typology Code available for payment source | Admitting: Allergy

## 2020-03-17 ENCOUNTER — Encounter: Payer: Self-pay | Admitting: Allergy

## 2020-03-17 ENCOUNTER — Other Ambulatory Visit: Payer: Self-pay

## 2020-03-17 VITALS — BP 120/70 | HR 116 | Temp 98.2°F | Resp 18 | Ht 63.39 in | Wt 141.2 lb

## 2020-03-17 DIAGNOSIS — J3089 Other allergic rhinitis: Secondary | ICD-10-CM

## 2020-03-17 DIAGNOSIS — L301 Dyshidrosis [pompholyx]: Secondary | ICD-10-CM | POA: Diagnosis not present

## 2020-03-17 DIAGNOSIS — T781XXD Other adverse food reactions, not elsewhere classified, subsequent encounter: Secondary | ICD-10-CM

## 2020-03-17 DIAGNOSIS — L282 Other prurigo: Secondary | ICD-10-CM | POA: Diagnosis not present

## 2020-03-17 DIAGNOSIS — W57XXXD Bitten or stung by nonvenomous insect and other nonvenomous arthropods, subsequent encounter: Secondary | ICD-10-CM | POA: Diagnosis not present

## 2020-03-17 MED ORDER — EUCRISA 2 % EX OINT: TOPICAL_OINTMENT | CUTANEOUS | 1 refills | Status: DC

## 2020-03-17 MED ORDER — TRIAMCINOLONE ACETONIDE 0.1 % EX OINT: 1.0000 "application " | TOPICAL_OINTMENT | Freq: Two times a day (BID) | CUTANEOUS | 1 refills | Status: DC | PRN

## 2020-03-17 MED FILL — EUCRISA 2 % OINT: 2 | 30 days supply | Qty: 100 | Fill #0

## 2020-03-17 MED FILL — TRIAMCINOLONE 0.1% OINTMENT: 0.1 | 30 days supply | Qty: 80 | Fill #0

## 2020-03-17 NOTE — Patient Instructions (Addendum)
Rash:  . Not sure what's causing or triggering your rashes - or if there's even a trigger.  Marland Kitchen Keep track of flares and take pictures.  . Get bloodwork:  o We are ordering labs, so please allow 1-2 weeks for the results to come back. o With the newly implemented Cures Act, the labs might be visible to you at the same time that they become visible to me. However, I will not address the results until all of the results are back, so please be patient.    Take zyrtec (ceterizine) 10mg  daily to help with the rash/itching. May take twice a day if needed.   See below for proper skin care - only use fragrance free and dye free products.   May use Eucrisa twice a day as needed for the hands.  May use triamcinolone 0.1% ointment twice a day as needed for rash flares. Do not use on the face, neck, armpits or groin area. Do not use more than 3 weeks in a row.   May need biopsy of the rash if not improving by dermatology.   Recommend patch testing.  Patches are best placed on Monday with return to office on Wednesday and Friday of same week for readings.  Patches once placed should not get wet.  You do not have to stop any medications for patch testing but should not be on oral prednisone. You can schedule a patch testing visit when convenient for your schedule.   Sun:  Take zyrtec 10mg  before exposure and may take twice a day if needed.  Make sure to wear sunscreen.   Environmental allergies:  May use over the counter antihistamines such as Zyrtec (cetirizine), Claritin (loratadine), Allegra (fexofenadine), or Xyzal (levocetirizine) daily as needed.  If not controlled we can repeat skin testing in future.  Food:  Avoid kiwi.   Will get kiwi bloodwork.   For mild symptoms you can take over the counter antihistamines such as Benadryl and monitor symptoms closely. If symptoms worsen or if you have severe symptoms including breathing issues, throat closure, significant swelling, whole body  hives, severe diarrhea and vomiting, lightheadedness then seek immediate medical care.  Medication allergies:  Continue to avoid medications on your allergy list.  Follow up in 3 months or sooner if needed.    After September 2021, I will not be in the [facility] office on a regular basis.   You can continue your care and follow up with Dr. Verlin Fester or nurse practitioner Gareth Morgan in [place] [facility].  OR you can follow up with me in our [place] ([address]) or [employer] [phone]) location.  Sincerely,  Rexene Alberts, DO  [facility] [place] [facility] office: [phone] [facility] office: [place] office: [phone]   Skin care recommendations  Bath time: . Always use lukewarm water. AVOID very hot or cold water. Marland Kitchen Keep bathing time to 5-10 minutes. . Do NOT use bubble bath. . Use a mild soap and use just enough to wash the dirty areas. . Do NOT scrub skin vigorously.  . After bathing, pat dry your skin with a towel. Do NOT rub or scrub the skin.  Moisturizers and prescriptions:  . ALWAYS apply moisturizers immediately after bathing (within 3 minutes). This helps to lock-in moisture. . Use the moisturizer several times a day over the whole body. Kermit Balo summer moisturizers include: Aveeno, CeraVe, Cetaphil. Kermit Balo winter moisturizers include: Aquaphor, Vaseline, Cerave, Cetaphil,  Eucerin, Vanicream. . When using moisturizers along with medications, the moisturizer should be applied about one hour after applying the medication to prevent diluting effect of the medication or moisturize around where you applied the medications. When not using medications, the moisturizer can be continued twice daily as maintenance.  Laundry and clothing: . Avoid laundry products with added color or perfumes. . Use unscented hypo-allergenic laundry products such as Tide free, Cheer free & gentle, and All free and clear.  . If the skin still  seems dry or sensitive, you can try double-rinsing the clothes. . Avoid tight or scratchy clothing such as wool. . Do not use fabric softeners or dyer sheets.

## 2020-03-19 ENCOUNTER — Encounter: Payer: Self-pay | Admitting: Allergy

## 2020-03-19 DIAGNOSIS — L282 Other prurigo: Secondary | ICD-10-CM | POA: Insufficient documentation

## 2020-03-19 DIAGNOSIS — L301 Dyshidrosis [pompholyx]: Secondary | ICD-10-CM | POA: Insufficient documentation

## 2020-03-19 DIAGNOSIS — J3089 Other allergic rhinitis: Secondary | ICD-10-CM | POA: Insufficient documentation

## 2020-03-19 DIAGNOSIS — W57XXXA Bitten or stung by nonvenomous insect and other nonvenomous arthropods, initial encounter: Secondary | ICD-10-CM | POA: Insufficient documentation

## 2020-03-19 DIAGNOSIS — T781XXA Other adverse food reactions, not elsewhere classified, initial encounter: Secondary | ICD-10-CM | POA: Insufficient documentation

## 2020-03-19 NOTE — Assessment & Plan Note (Signed)
   Will check hymenoptera panel and fire ant via bloodwork.

## 2020-03-19 NOTE — Assessment & Plan Note (Signed)
History of breaking out in pruritic rash with multiple potential triggers including sun exposure, poison ivy, and insects. Follows with a dermatology physician assistant. No prior patch testing or skin biopsy. Treated with systemic steroids and oral antihistamines. . Discussed with patient that not sure what's causing or triggering these rashes - or if there's even a trigger.  Marland Kitchen Keep track of flares and take pictures.  . Get bloodwork as below to rule out other etiologies.   Take zyrtec (cetrizine) 10mg  daily to help with the rash/itching. May take twice a day if needed.   See below for proper skin care - only use fragrance free and dye free products.   May use Eucrisa twice a day as needed for the hands.  May use triamcinolone 0.1% ointment twice a day as needed for rash flares. Do not use on the face, neck, armpits or groin area. Do not use more than 3 weeks in a row.   May need biopsy of the rash if not improving by dermatology.   Recommend patch testing.  Prior to sun exposure:   Take zyrtec 10mg  before exposure and may take twice a day if needed.  Make sure to wear sunscreen.

## 2020-03-19 NOTE — Assessment & Plan Note (Addendum)
Mild rhinitis symptoms and takes OTC antihistamines with good benefit. Skin testing in 2008 showed multiple positives per patient report.   Monitor symptoms.   May use over the counter antihistamines such as Zyrtec (cetirizine), Claritin (loratadine), Allegra (fexofenadine), or Xyzal (levocetirizine) daily as needed.  If symptoms worsen recommend re-testing.

## 2020-03-19 NOTE — Assessment & Plan Note (Addendum)
One episode of dyshidrotic eczema on the hands bilaterally.  May use Eucrisa twice a day as needed.

## 2020-03-19 NOTE — Assessment & Plan Note (Signed)
Patient concerned if the dye in certain medications triggering these rashes. History of oral angioedema with kiwi.  Continue to avoid kiwi.   Will get kiwi IgE and check for red/yellow dye IgE via bloodwork.   For mild symptoms you can take over the counter antihistamines such as Benadryl and monitor symptoms closely. If symptoms worsen or if you have severe symptoms including breathing issues, throat closure, significant swelling, whole body hives, severe diarrhea and vomiting, lightheadedness then seek immediate medical care.

## 2020-03-28 ENCOUNTER — Encounter: Payer: Self-pay | Admitting: Family

## 2020-03-28 ENCOUNTER — Other Ambulatory Visit (HOSPITAL_COMMUNITY): Payer: Self-pay | Admitting: Obstetrics and Gynecology

## 2020-03-28 MED FILL — ESTRADIOL 0.5 MG TABS: 0.5 | 90 days supply | Qty: 90 | Fill #0

## 2020-03-29 ENCOUNTER — Other Ambulatory Visit: Payer: Self-pay | Admitting: Allergy

## 2020-03-29 LAB — ALPHA-GAL PANEL
Alpha Gal IgE*: <0.1 kU/L (ref <0.10)
Beef (Bos spp) IgE: <0.1 kU/L (ref <0.35)
Class Interpretation: 0
Class Interpretation: 0
Class Interpretation: 0
Lamb/Mutton (Ovis spp) IgE: <0.1 kU/L (ref <0.35)
Pork (Sus spp) IgE: <0.1 kU/L (ref <0.35)

## 2020-03-29 LAB — COMPREHENSIVE METABOLIC PANEL
ALT: 15 IU/L (ref 0–32)
AST: 16 IU/L (ref 0–40)
Albumin/Globulin Ratio: 2 (ref 1.2–2.2)
Albumin: 4.5 g/dL (ref 3.8–4.9)
Alkaline Phosphatase: 69 IU/L (ref 48–121)
BUN/Creatinine Ratio: 14 (ref 9–23)
BUN: 11 mg/dL (ref 6–24)
Bilirubin Total: 0.4 mg/dL (ref 0.0–1.2)
CO2: 24 mmol/L (ref 20–29)
Calcium: 9.1 mg/dL (ref 8.7–10.2)
Chloride: 103 mmol/L (ref 96–106)
Creatinine, Ser: 0.78 mg/dL (ref 0.57–1.00)
GFR calc Af Amer: 100 mL/min/{1.73_m2} (ref >59)
GFR calc non Af Amer: 87 mL/min/{1.73_m2} (ref >59)
Globulin, Total: 2.2 g/dL (ref 1.5–4.5)
Glucose: 113 mg/dL — ABNORMAL HIGH (ref 65–99)
Potassium: 4.5 mmol/L (ref 3.5–5.2)
Sodium: 142 mmol/L (ref 134–144)
Total Protein: 6.7 g/dL (ref 6.0–8.5)

## 2020-03-29 LAB — CBC WITH DIFFERENTIAL/PLATELET
Basophils Absolute: 0 10*3/uL (ref 0.0–0.2)
Basos: 1 %
EOS (ABSOLUTE): 0.1 10*3/uL (ref 0.0–0.4)
Eos: 2 %
Hematocrit: 44.2 % (ref 34.0–46.6)
Hemoglobin: 14.8 g/dL (ref 11.1–15.9)
Immature Grans (Abs): 0 10*3/uL (ref 0.0–0.1)
Immature Granulocytes: 0 %
Lymphocytes Absolute: 2 10*3/uL (ref 0.7–3.1)
Lymphs: 30 %
MCH: 30.7 pg (ref 26.6–33.0)
MCHC: 33.5 g/dL (ref 31.5–35.7)
MCV: 92 fL (ref 79–97)
Monocytes Absolute: 0.5 10*3/uL (ref 0.1–0.9)
Monocytes: 7 %
Neutrophils Absolute: 4.1 10*3/uL (ref 1.4–7.0)
Neutrophils: 60 %
Platelets: 265 10*3/uL (ref 150–450)
RBC: 4.82 x10E6/uL (ref 3.77–5.28)
RDW: 12.7 % (ref 11.7–15.4)
WBC: 6.8 10*3/uL (ref 3.4–10.8)

## 2020-03-29 LAB — THYROID CASCADE PROFILE: TSH: 0.627 u[IU]/mL (ref 0.450–4.500)

## 2020-03-29 LAB — TRYPTASE: Tryptase: 4 ug/L (ref 2.2–13.2)

## 2020-03-29 LAB — ALLERGEN HYMENOPTERA PANEL
Bumblebee: <0.1 kU/L
Honeybee IgE: <0.1 kU/L
Hornet, White Face, IgE: <0.1 kU/L
Hornet, Yellow, IgE: <0.1 kU/L
Paper Wasp IgE: <0.1 kU/L
Yellow Jacket, IgE: <0.1 kU/L

## 2020-03-29 LAB — ALLERGEN, KIWI FRUIT, F84: Kiwi Fruit: <0.1 kU/L

## 2020-03-29 LAB — ALLERGEN, RED (CARMINE) DYE, RF340: F340-IgE Carmine Red Dye: <0.1 kU/L

## 2020-03-29 LAB — SEDIMENTATION RATE: Sed Rate: 6 mm/hr (ref 0–40)

## 2020-03-29 LAB — CHRONIC URTICARIA: cu index: 3.3 (ref <10)

## 2020-03-29 LAB — ALLERGEN, ANNATTO SEED
Annatto Seed IgE*: <0.35 kU/L (ref <0.35)
Class Interpretation: 0

## 2020-03-29 LAB — ALLERGEN FIRE ANT: I070-IgE Fire Ant (Invicta): 5.81 kU/L — AB

## 2020-03-29 LAB — C-REACTIVE PROTEIN: CRP: <1 mg/L (ref 0–10)

## 2020-03-29 MED ORDER — EPINEPHRINE 0.3 MG/0.3ML IJ SOAJ: 0.3000 mg | INTRAMUSCULAR | 2 refills | Status: DC | PRN

## 2020-03-29 MED FILL — AMPHETAMINE SALTS 10 MG: 10 | 30 days supply | Qty: 60 | Fill #0

## 2020-03-29 MED FILL — EPINEPHRINE 0.3 MG AUTO-INJ: 0.3 | 2 days supply | Qty: 2 | Fill #0

## 2020-04-06 ENCOUNTER — Other Ambulatory Visit (HOSPITAL_COMMUNITY): Payer: Self-pay | Admitting: Psychiatry

## 2020-04-07 MED FILL — clonazePAM 1 MG TABS: 1 | 90 days supply | Qty: 180 | Fill #0

## 2020-04-16 ENCOUNTER — Other Ambulatory Visit: Payer: Self-pay | Admitting: Cardiovascular Disease

## 2020-04-16 ENCOUNTER — Telehealth: Payer: Self-pay | Admitting: Cardiovascular Disease

## 2020-04-16 DIAGNOSIS — J1282 Pneumonia due to coronavirus disease 2019: Secondary | ICD-10-CM

## 2020-04-16 DIAGNOSIS — Z7189 Other specified counseling: Secondary | ICD-10-CM

## 2020-04-16 DIAGNOSIS — U071 COVID-19: Secondary | ICD-10-CM

## 2020-04-16 MED ORDER — DOXYCYCLINE HYCLATE 100 MG PO TABS: 100.0000 mg | ORAL_TABLET | Freq: Two times a day (BID) | ORAL | Status: DC

## 2020-04-16 MED ORDER — IVERMECTIN 3 MG PO TABS: 18.0000 mg | ORAL_TABLET | Freq: Every day | ORAL | Status: DC

## 2020-04-16 NOTE — Telephone Encounter (Signed)
Pam has had cough, sinus congestion, shortness of breath for the past 5 days Home covid test today was positive for cofid  Will give her ivermedtin 0.3 mg daily for 5 days 18 mg a  Day   Doxycycline 100 mg bid for 5 days  O2 sats are 98%. She will call if her sats fall or if she develops more symptoms     Mertie Moores, MD  04/16/2020 2:34 PM    [address],  [place], [place]  [address] Phone: [phone]; Fax: [phone]

## 2020-05-03 ENCOUNTER — Encounter: Payer: Self-pay | Admitting: Internal Medicine

## 2020-05-08 ENCOUNTER — Telehealth: Payer: Self-pay | Admitting: Internal Medicine

## 2020-05-08 ENCOUNTER — Other Ambulatory Visit: Payer: Self-pay | Admitting: Internal Medicine

## 2020-05-08 DIAGNOSIS — M797 Fibromyalgia: Secondary | ICD-10-CM

## 2020-05-08 MED ORDER — NORTRIPTYLINE HCL 25 MG PO CAPS: 50.0000 mg | ORAL_CAPSULE | Freq: Every day | ORAL | 0 refills | Status: DC

## 2020-05-08 MED ORDER — NALTREXONE HCL POWD: 5.0000 mg | Freq: Every day | 0 refills | Status: DC

## 2020-05-08 MED FILL — NORTRIPTYLINE HCL 25 MG CAP: 25 | 90 days supply | Qty: 180 | Fill #0

## 2020-05-08 NOTE — Telephone Encounter (Signed)
Naltrexone HCl POWD Pharmacy calling confused about the prescription because of the dispense is 4.5 g but it comes in capsules.  [phone]

## 2020-05-08 NOTE — Telephone Encounter (Signed)
I have spoke to [facility] and confirmed that it can be changed to capsules with a quantity of 90.

## 2020-05-12 ENCOUNTER — Other Ambulatory Visit (HOSPITAL_COMMUNITY): Payer: Self-pay | Admitting: Psychiatry

## 2020-05-26 MED FILL — AMPHETAMINE SALTS 10 MG: 10 | 30 days supply | Qty: 60 | Fill #0

## 2020-06-07 ENCOUNTER — Encounter: Payer: Self-pay | Admitting: Internal Medicine

## 2020-06-07 ENCOUNTER — Other Ambulatory Visit: Payer: Self-pay

## 2020-06-07 ENCOUNTER — Ambulatory Visit (INDEPENDENT_AMBULATORY_CARE_PROVIDER_SITE_OTHER): Payer: No Typology Code available for payment source | Admitting: Internal Medicine

## 2020-06-07 VITALS — BP 126/72 | HR 90 | Temp 98.3°F | Resp 16 | Ht 63.0 in | Wt 142.0 lb

## 2020-06-07 DIAGNOSIS — Z0000 Encounter for general adult medical examination without abnormal findings: Secondary | ICD-10-CM | POA: Diagnosis not present

## 2020-06-07 DIAGNOSIS — Z0001 Encounter for general adult medical examination with abnormal findings: Secondary | ICD-10-CM

## 2020-06-07 NOTE — Progress Notes (Signed)
Subjective:  Patient ID: Janice Spencer, female    DOB: [birth year]  Age: 54 y.o. MRN: [id number]  CC: Annual Exam  This visit occurred during the SARS-CoV-2 public health emergency.  Safety protocols were in place, including screening questions prior to the visit, additional usage of staff PPE, and extensive cleaning of exam room while observing appropriate contact time as indicated for disinfecting solutions.    HPI Janice Spencer presents for a CPX.  She was diagnosed and treated for Covid about 2 months ago.  She feels like the infection has resolved and she has no lingering symptoms.  She has her usual set of symptoms but nothing new or different.  Outpatient Medications Prior to Visit  Medication Sig Dispense Refill   amphetamine-dextroamphetamine (ADDERALL) 10 MG tablet Take 10 mg by mouth daily with breakfast.     Ascorbic Acid (VITAMIN C PO) Take 1,000 mg by mouth daily.     clonazePAM (KLONOPIN) 1 MG tablet Take 0.5-1 mg by mouth at bedtime as needed.     Crisaborole (EUCRISA) 2 % OINT May use Eucrisa twice a day for dyshidrotic eczema on hands if needed. 100 g 1   EPINEPHrine 0.3 mg/0.3 mL IJ SOAJ injection Inject 0.3 mLs (0.3 mg total) into the muscle as needed for anaphylaxis. 2 each 1   EPINEPHrine 0.3 mg/0.3 mL IJ SOAJ injection Inject 0.3 mg into the muscle as needed. 1 each 2   fluocinonide cream (LIDEX) 5.05 % Apply 1 application topically 2 (two) times daily. 60 g 1   hydrOXYzine (ATARAX/VISTARIL) 25 MG tablet Take 1 tablet (25 mg total) by mouth every 8 (eight) hours as needed. 90 tablet 0   Multiple Vitamins-Minerals (ZINC PO) Take 25 mg by mouth daily.     Naltrexone HCl POWD Take 5 mg by mouth daily. 4.5 g 0   nortriptyline (PAMELOR) 25 MG capsule Take 2 capsules (50 mg total) by mouth at bedtime. 180 capsule 0   triamcinolone ointment (KENALOG) 0.1 % Apply 1 application topically 2 (two) times daily as needed. Do not use on the face, neck, armpits  or groin area. Do not use more than 3 weeks in a row. 80 g 1   estradiol (VIVELLE-DOT) 0.05 MG/24HR patch estradiol 0.05 mg/24 hr semiweekly transdermal patch     No facility-administered medications prior to visit.    ROS Review of Systems  Constitutional: Positive for fatigue. Negative for diaphoresis and unexpected weight change.  HENT: Negative.   Eyes: Negative for visual disturbance.  Respiratory: Negative for cough, chest tightness, shortness of breath and wheezing.   Cardiovascular: Negative for chest pain, palpitations and leg swelling.  Gastrointestinal: Negative for abdominal pain, blood in stool, constipation, diarrhea, nausea and vomiting.  Endocrine: Negative.   Genitourinary: Negative.   Musculoskeletal: Positive for arthralgias and myalgias.  Skin: Negative.  Negative for color change and rash.  Neurological: Negative.  Negative for dizziness, weakness, light-headedness and headaches.  Hematological: Negative for adenopathy. Does not bruise/bleed easily.  Psychiatric/Behavioral: Positive for dysphoric mood. Negative for sleep disturbance and suicidal ideas. The patient is not nervous/anxious.     Objective:  BP 126/72    Pulse 90    Temp 98.3 F (36.8 C) (Oral)    Resp 16    Ht 5\' 3"  (1.6 m)    Wt 142 lb (64.4 kg)    SpO2 99%    BMI 25.15 kg/m   BP Readings from Last 3 Encounters:  06/07/20 126/72  03/17/20 120/70  02/28/20 112/78    Wt Readings from Last 3 Encounters:  06/07/20 142 lb (64.4 kg)  03/17/20 141 lb 3.2 oz (64 kg)  02/28/20 142 lb (64.4 kg)    Physical Exam Vitals reviewed.  Constitutional:      Appearance: Normal appearance.  HENT:     Nose: Nose normal.     Mouth/Throat:     Mouth: Mucous membranes are moist.  Eyes:     General: No scleral icterus.    Conjunctiva/sclera: Conjunctivae normal.  Cardiovascular:     Rate and Rhythm: Normal rate and regular rhythm.     Heart sounds: No murmur heard.   Pulmonary:     Effort: Pulmonary  effort is normal.     Breath sounds: No stridor. No wheezing, rhonchi or rales.  Abdominal:     General: Abdomen is flat. Bowel sounds are normal. There is no distension.     Palpations: Abdomen is soft. There is no hepatomegaly or mass.     Tenderness: There is no abdominal tenderness.  Musculoskeletal:        General: Normal range of motion.     Cervical back: Neck supple.     Right lower leg: No edema.     Left lower leg: No edema.  Lymphadenopathy:     Cervical: No cervical adenopathy.  Skin:    General: Skin is warm and dry.     Coloration: Skin is not pale.  Neurological:     General: No focal deficit present.     Mental Status: She is alert.  Psychiatric:        Mood and Affect: Mood normal.        Behavior: Behavior normal.     Lab Results  Component Value Date   WBC 6.8 03/17/2020   HGB 14.8 03/17/2020   HCT 44.2 03/17/2020   PLT 265 03/17/2020   GLUCOSE 113 (H) 03/17/2020   CHOL 218 (H) 05/13/2019   TRIG 74.0 05/13/2019   HDL 67.30 05/13/2019   LDLCALC 136 (H) 05/13/2019   ALT 15 03/17/2020   AST 16 03/17/2020   NA 142 03/17/2020   K 4.5 03/17/2020   CL 103 03/17/2020   CREATININE 0.78 03/17/2020   BUN 11 03/17/2020   CO2 24 03/17/2020   TSH 0.627 03/17/2020   HGBA1C 5.6 12/13/2015    No results found.  Assessment & Plan:   Janice Spencer was seen today for annual exam.  Diagnoses and all orders for this visit:  Encounter for general adult medical examination with abnormal findings- Exam completed, labs reviewed, vaccines reviewed, cancer screenings are up-to-date, patient education was given.   I have discontinued Janin Kozlowski. Doeden "Pam"'s estradiol. I am also having her maintain her amphetamine-dextroamphetamine, clonazePAM, hydrOXYzine, fluocinonide cream, EPINEPHrine, Ascorbic Acid (VITAMIN C PO), Multiple Vitamins-Minerals (ZINC PO), Eucrisa, triamcinolone ointment, EPINEPHrine, nortriptyline, and Naltrexone HCl.  No orders of the defined types were  placed in this encounter.    Follow-up: No follow-ups on file.  Scarlette Calico, MD

## 2020-06-08 ENCOUNTER — Encounter: Payer: Self-pay | Admitting: Internal Medicine

## 2020-06-09 ENCOUNTER — Other Ambulatory Visit (HOSPITAL_COMMUNITY): Payer: Self-pay | Admitting: Psychiatry

## 2020-06-19 ENCOUNTER — Ambulatory Visit: Payer: No Typology Code available for payment source | Admitting: Family Medicine

## 2020-07-04 MED FILL — AMPHETAMINE SALTS 10 MG: 10 | 30 days supply | Qty: 60 | Fill #0

## 2020-07-04 MED FILL — ESTRADIOL 0.5 MG TABS: 0.5 | 90 days supply | Qty: 90 | Fill #1

## 2020-07-10 ENCOUNTER — Other Ambulatory Visit (HOSPITAL_COMMUNITY): Payer: Self-pay | Admitting: Psychiatry

## 2020-08-10 ENCOUNTER — Other Ambulatory Visit (HOSPITAL_COMMUNITY): Payer: Self-pay | Admitting: Psychiatry

## 2020-08-18 MED FILL — AMPHETAMINE SALTS 10 MG: 10 | 30 days supply | Qty: 60 | Fill #0

## 2020-08-18 MED FILL — clonazePAM 1 MG TABS: 1 | 90 days supply | Qty: 180 | Fill #1

## 2020-08-25 ENCOUNTER — Other Ambulatory Visit: Payer: Self-pay | Admitting: Cardiology

## 2020-08-25 ENCOUNTER — Telehealth: Payer: Self-pay | Admitting: Cardiology

## 2020-08-25 MED ORDER — CEPHALEXIN 500 MG PO CAPS: 500.0000 mg | ORAL_CAPSULE | Freq: Four times a day (QID) | ORAL | 0 refills | Status: DC

## 2020-08-25 MED FILL — CEPHALEXIN 500 MG CAPSULE: 500 | 7 days supply | Qty: 28 | Fill #0

## 2020-08-25 NOTE — Telephone Encounter (Signed)
  Earlier today woke up with left-sided upper facial swelling, some tenderness.  Throughout the next several hours noted some increase in size of the swelling.  No subjective fevers.  No trouble swallowing.  Does have some discomfort when opening the jaw fully.  On exam, noticeable swelling noted left parotid region traversing down to just below the mandible.  No erythema.  Minimal tenderness.  Spoke to Dr. Melida Quitter of ear nose and throat.  Parotid inflammation or sialadenitis.  -Sour candy, hydration, ibuprofen, continue monitoring, massage, heat -Dr. Redmond Baseman suggested starting a trial of Keflex, will give 500 mg 4 times a day for 7 days.  If symptoms do not improve, we will revisit with ENT.  Keflex and penicillin both listed under allergies but in discussion, penicillin did cause rash but unsure of what reaction may have occurred with Keflex.  Willing to try again.  It was not anaphylaxis.  Was not a serious life-threatening reaction.  Candee Furbish, MD

## 2020-09-27 MED FILL — AMPHETAMINE SALTS 10 MG: 10 | 30 days supply | Qty: 60 | Fill #0

## 2020-10-09 ENCOUNTER — Other Ambulatory Visit (HOSPITAL_COMMUNITY): Payer: Self-pay

## 2020-10-09 MED ORDER — ESTRADIOL 0.5 MG PO TABS
0.5000 mg | ORAL_TABLET | Freq: Every day | ORAL | 4 refills | Status: DC
  Filled 2020-10-09 – 2020-10-13 (×2): qty 90, 90d supply, fill #0
  Filled 2021-01-11: qty 90, 90d supply, fill #1
  Filled 2021-03-16 – 2021-04-18 (×2): qty 90, 90d supply, fill #2

## 2020-10-11 ENCOUNTER — Other Ambulatory Visit: Payer: Self-pay | Admitting: Obstetrics and Gynecology

## 2020-10-11 DIAGNOSIS — R928 Other abnormal and inconclusive findings on diagnostic imaging of breast: Secondary | ICD-10-CM

## 2020-10-13 ENCOUNTER — Other Ambulatory Visit (HOSPITAL_COMMUNITY): Payer: Self-pay

## 2020-10-19 ENCOUNTER — Other Ambulatory Visit: Payer: Self-pay

## 2020-10-19 ENCOUNTER — Other Ambulatory Visit: Payer: Self-pay | Admitting: Obstetrics and Gynecology

## 2020-10-19 ENCOUNTER — Ambulatory Visit
Admission: RE | Admit: 2020-10-19 | Discharge: 2020-10-19 | Disposition: A | Discharge status: Home / Self Care | Payer: No Typology Code available for payment source | Source: Ambulatory Visit | Attending: Obstetrics and Gynecology | Admitting: Obstetrics and Gynecology

## 2020-10-19 DIAGNOSIS — R928 Other abnormal and inconclusive findings on diagnostic imaging of breast: Secondary | ICD-10-CM

## 2020-10-27 ENCOUNTER — Other Ambulatory Visit: Payer: Self-pay

## 2020-10-27 ENCOUNTER — Ambulatory Visit
Admission: RE | Admit: 2020-10-27 | Discharge: 2020-10-27 | Disposition: A | Discharge status: Home / Self Care | Payer: No Typology Code available for payment source | Source: Ambulatory Visit | Attending: Obstetrics and Gynecology | Admitting: Obstetrics and Gynecology

## 2020-10-27 DIAGNOSIS — R928 Other abnormal and inconclusive findings on diagnostic imaging of breast: Secondary | ICD-10-CM

## 2020-11-01 ENCOUNTER — Other Ambulatory Visit (HOSPITAL_COMMUNITY): Payer: Self-pay

## 2020-11-01 ENCOUNTER — Other Ambulatory Visit: Payer: Self-pay | Admitting: Obstetrics and Gynecology

## 2020-11-01 ENCOUNTER — Other Ambulatory Visit: Payer: Self-pay | Admitting: Internal Medicine

## 2020-11-01 DIAGNOSIS — Z803 Family history of malignant neoplasm of breast: Secondary | ICD-10-CM

## 2020-11-01 DIAGNOSIS — M797 Fibromyalgia: Secondary | ICD-10-CM

## 2020-11-01 MED ORDER — AMPHETAMINE-DEXTROAMPHETAMINE 10 MG PO TABS
ORAL_TABLET | Freq: Two times a day (BID) | ORAL | 0 refills | Status: DC
  Filled 2020-11-01: qty 60, 30d supply, fill #0

## 2020-11-01 MED ORDER — NORTRIPTYLINE HCL 25 MG PO CAPS
50.0000 mg | ORAL_CAPSULE | Freq: Every day | ORAL | 0 refills | Status: DC
  Filled 2020-11-01: qty 180, 90d supply, fill #0

## 2020-11-02 ENCOUNTER — Other Ambulatory Visit: Payer: No Typology Code available for payment source

## 2020-11-16 ENCOUNTER — Encounter: Payer: Self-pay | Admitting: Internal Medicine

## 2020-11-17 ENCOUNTER — Ambulatory Visit
Admission: RE | Admit: 2020-11-17 | Discharge: 2020-11-17 | Disposition: A | Discharge status: Home / Self Care | Payer: No Typology Code available for payment source | Source: Ambulatory Visit | Attending: Obstetrics and Gynecology | Admitting: Obstetrics and Gynecology

## 2020-11-17 DIAGNOSIS — Z803 Family history of malignant neoplasm of breast: Secondary | ICD-10-CM

## 2020-11-17 MED ORDER — GADOBUTROL 1 MMOL/ML IV SOLN
7.0000 mL | Freq: Once | INTRAVENOUS | Status: AC | PRN
  Administered 2020-11-17: 7 mL via INTRAVENOUS

## 2020-11-21 ENCOUNTER — Other Ambulatory Visit: Payer: Self-pay | Admitting: Internal Medicine

## 2020-11-21 DIAGNOSIS — M797 Fibromyalgia: Secondary | ICD-10-CM

## 2020-12-22 ENCOUNTER — Other Ambulatory Visit (HOSPITAL_COMMUNITY): Payer: Self-pay

## 2020-12-22 MED ORDER — AMPHETAMINE-DEXTROAMPHETAMINE 10 MG PO TABS
10.0000 mg | ORAL_TABLET | Freq: Two times a day (BID) | ORAL | 0 refills | Status: DC
  Filled 2020-12-22: qty 60, 30d supply, fill #0

## 2020-12-25 ENCOUNTER — Other Ambulatory Visit (HOSPITAL_COMMUNITY): Payer: Self-pay

## 2020-12-25 ENCOUNTER — Other Ambulatory Visit: Payer: Self-pay

## 2021-01-01 ENCOUNTER — Encounter: Payer: Self-pay | Admitting: Internal Medicine

## 2021-01-01 ENCOUNTER — Other Ambulatory Visit: Payer: Self-pay

## 2021-01-01 ENCOUNTER — Ambulatory Visit (INDEPENDENT_AMBULATORY_CARE_PROVIDER_SITE_OTHER): Payer: No Typology Code available for payment source | Admitting: Internal Medicine

## 2021-01-01 VITALS — BP 110/73 | HR 98 | Ht 63.5 in | Wt 134.0 lb

## 2021-01-01 DIAGNOSIS — M797 Fibromyalgia: Secondary | ICD-10-CM

## 2021-01-01 DIAGNOSIS — G4701 Insomnia due to medical condition: Secondary | ICD-10-CM | POA: Diagnosis not present

## 2021-01-01 NOTE — Patient Instructions (Signed)
I recommend checking out the [facility] patient-centered guide for fibromyalgia and chronic pain management: [email or web address]  I will discuss your ongoing symptoms with Dr. Estanislado Pandy if she has any other recommendations and give you a call.  Continue trying to get aerobic exercise regularly even if in small amounts, this is very important in counteracting the over sensitization to painful sensations seen with fibromyalgia.   Myofascial Pain Syndrome and Fibromyalgia Myofascial pain syndrome and fibromyalgia are both pain disorders. This pain may be felt mainly in your muscles. Myofascial pain syndrome: Always has tender points in the muscle that will cause pain when pressed (trigger points). The pain may come and go. Usually affects your neck, upper back, and shoulder areas. The pain often radiates into your arms and hands. Fibromyalgia: Has muscle pains and tenderness that come and go. Is often associated with fatigue and sleep problems. Has trigger points. Tends to be long-lasting (chronic), but is not life-threatening. Fibromyalgia and myofascial pain syndrome are not the same. However, they often occur together. If you have both conditions, each can make the other worse. Both are common and can cause enough pain and fatigue to make day-to-day activities difficult. Both can be hard to diagnose because their symptoms arecommon in many other conditions. What are the causes? The exact causes of these conditions are not known. What increases the risk? You are more likely to develop this condition if: You have a family history of the condition. You have certain triggers, such as: Spine disorders. An injury (trauma) or other physical stressors. Being under a lot of stress. Medical conditions such as osteoarthritis, rheumatoid arthritis, or lupus. What are the signs or symptoms? Fibromyalgia The main symptom of fibromyalgia is widespread pain and tenderness in  yourmuscles. Pain is sometimes described as stabbing, shooting, or burning. You may also have: Tingling or numbness. Sleep problems and fatigue. Problems with attention and concentration (fibro fog). Other symptoms may include:  Bowel and bladder problems. Headaches. Visual problems. Problems with odors and noises. Depression or mood changes. Painful menstrual periods (dysmenorrhea). Dry skin or eyes. These symptoms can vary over time. Myofascial pain syndrome Symptoms of myofascial pain syndrome include: Tight, ropy bands of muscle. Uncomfortable sensations in muscle areas. These may include aching, cramping, burning, numbness, tingling, and weakness. Difficulty moving certain parts of the body freely (poor range of motion). How is this diagnosed? This condition may be diagnosed by your symptoms and medical history. You will also have a physical exam. In general: Fibromyalgia is diagnosed if you have pain, fatigue, and other symptoms for more than 3 months, and symptoms cannot be explained by another condition. Myofascial pain syndrome is diagnosed if you have trigger points in your muscles, and those trigger points are tender and cause pain elsewhere in your body (referred pain). How is this treated? Treatment for these conditions depends on the type that you have. For fibromyalgia: Pain medicines, such as NSAIDs. Medicines for treating depression. Medicines for treating seizures. Medicines that relax the muscles. For myofascial pain: Pain medicines, such as NSAIDs. Cooling and stretching of muscles. Trigger point injections. Sound wave (ultrasound) treatments to stimulate muscles. Treating these conditions often requires a team of health care providers. These may include: Your primary care provider. Physical therapist. Complementary health care providers, such as massage therapists or acupuncturists. Psychiatrist for cognitive behavioral therapy. Follow these instructions  at home: Medicines Take over-the-counter and prescription medicines only as told by your health care provider. Do not drive or use heavy machinery  while taking prescription pain medicine. If you are taking prescription pain medicine, take actions to prevent or treat constipation. Your health care provider may recommend that you: Drink enough fluid to keep your urine pale yellow. Eat foods that are high in fiber, such as fresh fruits and vegetables, whole grains, and beans. Limit foods that are high in fat and processed sugars, such as fried or sweet foods. Take an over-the-counter or prescription medicine for constipation. Lifestyle  Exercise as directed by your health care provider or physical therapist. Practice relaxation techniques to control your stress. You may want to try: Biofeedback. Visual imagery. Hypnosis. Muscle relaxation. Yoga. Meditation. Maintain a healthy lifestyle. This includes eating a healthy diet and getting enough sleep. Do not use any products that contain nicotine or tobacco, such as cigarettes and e-cigarettes. If you need help quitting, ask your health care provider.  General instructions Talk to your health care provider about complementary treatments, such as acupuncture or massage. Consider joining a support group with others who are diagnosed with this condition. Do not do activities that stress or strain your muscles. This includes repetitive motions and heavy lifting. Keep all follow-up visits as told by your health care provider. This is important. Where to find more information National Fibromyalgia Association: www.fmaware.[place]: www.arthritis.org American Chronic Pain Association: www.theacpa.org Contact a health care provider if: You have new symptoms. Your symptoms get worse or your pain is severe. You have side effects from your medicines. You have trouble sleeping. Your condition is causing depression or  anxiety. Summary Myofascial pain syndrome and fibromyalgia are pain disorders. Myofascial pain syndrome has tender points in the muscle that will cause pain when pressed (trigger points). Fibromyalgia also has muscle pains and tenderness that come and go, but this condition is often associated with fatigue and sleep disturbances. Fibromyalgia and myofascial pain syndrome are not the same but often occur together, causing pain and fatigue that make day-to-day activities difficult. Treatment for fibromyalgia includes taking medicines to relax the muscles and medicines for pain, depression, or seizures. Treatment for myofascial pain syndrome includes taking medicines for pain, cooling and stretching of muscles, and injecting medicines into trigger points. Follow your health care provider's instructions for taking medicines and maintaining a healthy lifestyle. This information is not intended to replace advice given to you by your health care provider. Make sure you discuss any questions you have with your healthcare provider. Document Revised: 10/16/2018 Document Reviewed: 07/09/2017 Elsevier Patient Education  2022 [employer].

## 2021-01-01 NOTE — Progress Notes (Signed)
Office Visit Note  Patient: Janice Spencer             Date of Birth: 26-Jan-1966           MRN: [id number]             PCP: Janith Lima, MD Referring: Janith Lima, MD Visit Date: 01/01/2021 Occupation: Cardiology RN  Subjective:  New Patient (Initial Visit) (Patient complains of neck, bilateral shoulder, bilateral hand, low back, bilateral hip, bilateral ankle and foot pain. )   History of Present Illness: Janice Spencer is a 55 y.o. female here for muscle and joint pains which are chronic she has been experiencing some degree of pain symptoms for decades.  In addition to symptoms attributed to fibromyalgia she has known primary osteoarthritis of the left knee and some symptoms of cervical arthritis and radiculopathy.  More recently she is having an increasing problem with knee pain exacerbation during weather changes such as thunderstorms. She has tried multiple treatments for the symptoms none very highly effective and previously experienced a syncopal episode when starting Cymbalta.  She has had some improvement with ability to sleep with addition of nortriptyline and Klonopin as needed.  Her sleep is a constant problem with both latency and interruption and feels she has had persistent issues of inadequate restorative sleep going on at least 15 years.  She also reports history of migraine headaches that are intermittent for years.  She has occasional difficulty with concentration or recall but not severe enough to impair her work function.  She has irritable bowel symptoms with mixed constipation and diarrhea.   Labs reviewed 03/2020 CU neg ESR wnl CRP wnl  10/2019 ESR wnl RF wnl ANA wnl  Activities of Daily Living:  Patient reports morning stiffness for several hours.   Patient Reports nocturnal pain.  Difficulty dressing/grooming: Reports Difficulty climbing stairs: Reports Difficulty getting out of chair: Reports Difficulty using hands for taps, buttons, cutlery,  and/or writing: Reports  Review of Systems  Constitutional:  Positive for fatigue.  HENT:  Positive for mouth dryness and nose dryness. Negative for mouth sores.   Eyes:  Positive for dryness. Negative for pain, itching and visual disturbance.  Respiratory:  Negative for cough, hemoptysis, shortness of breath and difficulty breathing.   Cardiovascular:  Negative for chest pain, palpitations and swelling in legs/feet.  Gastrointestinal:  Positive for constipation and diarrhea. Negative for abdominal pain and blood in stool.  Endocrine: Negative for increased urination.  Genitourinary:  Negative for painful urination.  Musculoskeletal:  Positive for joint pain, joint pain, joint swelling, myalgias, muscle weakness, morning stiffness, muscle tenderness and myalgias.  Skin:  Negative for color change, rash and redness.  Allergic/Immunologic: Negative for susceptible to infections.  Neurological:  Positive for dizziness, numbness, headaches and memory loss. Negative for weakness.  Hematological:  Negative for swollen glands.  Psychiatric/Behavioral:  Positive for sleep disturbance. Negative for confusion.    PMFS History:  Patient Active Problem List   Diagnosis Date Noted   Dyshidrotic eczema 03/19/2020   Other allergic rhinitis 03/19/2020   Primary osteoarthritis of left knee 09/06/2019   Cervical radiculopathy 04/02/2018   Insomnia due to medical condition 12/20/2015   Fibromyalgia muscle pain 10/18/2011   Regional enteritis ([place]) 09/13/2004    Past Medical History:  Diagnosis Date   Anxiety    Depression    Eczema    Endometriosis    Fibromyalgia 2012   IBS (irritable bowel syndrome)    Insomnia  Urticaria     Family History  Problem Relation Age of Onset   Hypertension Mother    Goiter Mother    Breast cancer Mother    Heart disease Father    Coronary artery disease Brother    Coronary artery disease Brother    Coronary artery disease Maternal Grandmother     Allergic rhinitis Maternal Grandmother    Angioedema Maternal Grandmother    Uterine cancer Maternal Grandmother    Lung cancer Maternal Grandmother    Healthy Son    Healthy Daughter    Cancer Neg Hx    Alcohol abuse Neg Hx    Stroke Neg Hx    Past Surgical History:  Procedure Laterality Date   CHOLECYSTECTOMY     nasal bone spur removed     TONSILLECTOMY     TYMPANOSTOMY TUBE PLACEMENT     VAGINAL HYSTERECTOMY     Social History   Social History Narrative   Not on file   Immunization History  Administered Date(s) Administered   Influenza Split 04/21/2014   Influenza Whole 03/24/2013   Influenza,inj,Quad PF,6+ Mos 04/07/2018, 04/07/2019   Influenza-Unspecified 04/07/2016, 04/07/2019, 04/07/2020   Tdap 10/15/2012, 10/16/2012     Objective: Vital Signs: BP 110/73 (BP Location: Right Arm, Patient Position: Sitting, Cuff Size: Normal)   Pulse 98   Ht 5' 3.5" (1.613 m)   Wt 134 lb (60.8 kg)   BMI 23.36 kg/m    Physical Exam HENT:     Right Ear: External ear normal.     Left Ear: External ear normal.     Mouth/Throat:     Mouth: Mucous membranes are moist.     Pharynx: Oropharynx is clear.  Eyes:     Conjunctiva/sclera: Conjunctivae normal.  Cardiovascular:     Rate and Rhythm: Normal rate and regular rhythm.  Pulmonary:     Effort: Pulmonary effort is normal.     Breath sounds: Normal breath sounds.  Skin:    General: Skin is warm and dry.     Findings: No rash.  Neurological:     General: No focal deficit present.     Mental Status: She is alert.     Deep Tendon Reflexes: Reflexes normal.     Comments: Numerous tender points and allodynia over most areas particularly the proximal arms legs and throughout the upper back  Psychiatric:        Mood and Affect: Mood normal.     Musculoskeletal Exam:  Shoulders full ROM, no swelling or weakness but tender to pressure laterally and approximately Elbows, wrists, and fingers full ROM no tenderness or  swelling Knees full ROM, patellofemoral and joint line crepitus in the left knee no effusions Ankles full ROM no tenderness or swelling    Investigation: No additional findings.  Imaging: No results found.  Recent Labs: Lab Results  Component Value Date   WBC 6.8 03/17/2020   HGB 14.8 03/17/2020   PLT 265 03/17/2020   NA 142 03/17/2020   K 4.5 03/17/2020   CL 103 03/17/2020   CO2 24 03/17/2020   GLUCOSE 113 (H) 03/17/2020   BUN 11 03/17/2020   CREATININE 0.78 03/17/2020   BILITOT 0.4 03/17/2020   ALKPHOS 69 03/17/2020   AST 16 03/17/2020   ALT 15 03/17/2020   PROT 6.7 03/17/2020   ALBUMIN 4.5 03/17/2020   CALCIUM 9.1 03/17/2020   GFRAA 100 03/17/2020    Speciality Comments: No specialty comments available.  Procedures:  No procedures performed Allergies:  Cymbalta [duloxetine hcl], Flagyl [metronidazole], Belsomra [suvorexant], Codeine, Keflex [cephalexin], Morphine, Other, Penicillins, Permethrin, and Erythromycin base   Assessment / Plan:     Visit Diagnoses: Fibromyalgia muscle pain  Ms. Muldrow demonstrates widespread pain also associated symptoms with fatigue, decreased exercise tolerance, chronic headache, irritable bowel syndrome consistent with fibromyalgia syndrome.  Underlying chronic pain associated with osteoarthritis of the knees and cervical radiculopathy.  She has had some previous medication intolerances is on reasonable other medicines for the fibromyalgia.  I discussed pharmacotherapy is in general a minority of the treatment plan and considering the amount she is on already needing to focus on ongoing aerobic exercise to reduce the generalized pain sensitization.  Recommended she review the [place] fibroguide site for other self-care strategies.  Insomnia due to medical condition  Discussed chronic fatigue and association with worsening chronic pain syndromes.  She is not overweight does not describe apneic symptoms so OSA less  suspected.  I discussed she might benefit from referral to sleep medicine anyways as if there is any intervenable contribution decreasing her fatigue would probably help the rest of the symptoms.  Orders: No orders of the defined types were placed in this encounter.  No orders of the defined types were placed in this encounter.    Follow-Up Instructions: No follow-ups on file.   Collier Salina, MD  Note - This record has been created using [employer].  Chart creation errors have been sought, but may not always  have been located. Such creation errors do not reflect on  the standard of medical care.

## 2021-01-02 ENCOUNTER — Other Ambulatory Visit (HOSPITAL_COMMUNITY): Payer: Self-pay

## 2021-01-02 ENCOUNTER — Other Ambulatory Visit: Payer: Self-pay

## 2021-01-09 ENCOUNTER — Other Ambulatory Visit (HOSPITAL_COMMUNITY): Payer: Self-pay

## 2021-01-09 ENCOUNTER — Other Ambulatory Visit: Payer: Self-pay

## 2021-01-10 ENCOUNTER — Other Ambulatory Visit (HOSPITAL_COMMUNITY): Payer: Self-pay

## 2021-01-10 ENCOUNTER — Other Ambulatory Visit: Payer: Self-pay

## 2021-01-11 ENCOUNTER — Other Ambulatory Visit (HOSPITAL_BASED_OUTPATIENT_CLINIC_OR_DEPARTMENT_OTHER): Payer: Self-pay

## 2021-01-11 ENCOUNTER — Other Ambulatory Visit (HOSPITAL_COMMUNITY): Payer: Self-pay

## 2021-01-11 ENCOUNTER — Encounter: Payer: Self-pay | Admitting: Internal Medicine

## 2021-01-11 DIAGNOSIS — G4701 Insomnia due to medical condition: Secondary | ICD-10-CM

## 2021-01-11 DIAGNOSIS — M797 Fibromyalgia: Secondary | ICD-10-CM

## 2021-01-11 MED ORDER — CYCLOBENZAPRINE HCL 10 MG PO TABS
10.0000 mg | ORAL_TABLET | Freq: Every evening | ORAL | 0 refills | Status: DC | PRN
Start: 2021-01-11 — End: 2021-10-17
  Filled 2021-01-11 (×2): qty 30, 30d supply, fill #0

## 2021-01-11 NOTE — Telephone Encounter (Signed)
I spoke with Ms. Janice Spencer I had discussed her case with Dr. Reynolds Bowl were briefly will recommend referral to [facility] for sleep medicine evaluation Dr. Brett Fairy if available otherwise alternate partner in the practice.  She is agreeable to this.  Discussed trial of at nighttime muscle relaxant medication for the overnight pain and insomnia.  Will prescribe Flexeril nightly as needed to try this.  If helpful we could try continuing this medicine her question for other alternate therapies might benefit from referral to integrative therapies or PM&R for other approaches.

## 2021-01-12 ENCOUNTER — Other Ambulatory Visit (HOSPITAL_COMMUNITY): Payer: Self-pay

## 2021-01-17 ENCOUNTER — Other Ambulatory Visit (HOSPITAL_COMMUNITY): Payer: Self-pay

## 2021-01-25 ENCOUNTER — Other Ambulatory Visit (HOSPITAL_COMMUNITY): Payer: Self-pay

## 2021-01-25 MED ORDER — CLONAZEPAM 1 MG PO TABS
0.5000 mg | ORAL_TABLET | Freq: Every evening | ORAL | 1 refills | Status: DC | PRN
  Filled 2021-01-25: qty 90, 90d supply, fill #0
  Filled 2021-03-16 – 2021-04-24 (×4): qty 90, 90d supply, fill #1

## 2021-01-25 MED ORDER — AMPHETAMINE-DEXTROAMPHETAMINE 10 MG PO TABS
10.0000 mg | ORAL_TABLET | Freq: Two times a day (BID) | ORAL | 0 refills | Status: DC
  Filled 2021-01-25: qty 60, 30d supply, fill #0

## 2021-03-16 ENCOUNTER — Other Ambulatory Visit (HOSPITAL_COMMUNITY): Payer: Self-pay

## 2021-03-16 ENCOUNTER — Other Ambulatory Visit: Payer: Self-pay | Admitting: Internal Medicine

## 2021-03-16 DIAGNOSIS — M797 Fibromyalgia: Secondary | ICD-10-CM

## 2021-03-16 MED ORDER — NORTRIPTYLINE HCL 25 MG PO CAPS
50.0000 mg | ORAL_CAPSULE | Freq: Every day | ORAL | 0 refills | Status: DC
  Filled 2021-03-16: qty 180, 90d supply, fill #0

## 2021-03-17 ENCOUNTER — Other Ambulatory Visit (HOSPITAL_COMMUNITY): Payer: Self-pay

## 2021-03-19 ENCOUNTER — Other Ambulatory Visit (HOSPITAL_COMMUNITY): Payer: Self-pay

## 2021-03-20 ENCOUNTER — Other Ambulatory Visit (HOSPITAL_COMMUNITY): Payer: Self-pay

## 2021-03-20 ENCOUNTER — Institutional Professional Consult (permissible substitution): Payer: No Typology Code available for payment source | Admitting: Neurology

## 2021-03-21 ENCOUNTER — Other Ambulatory Visit (HOSPITAL_COMMUNITY): Payer: Self-pay

## 2021-03-22 ENCOUNTER — Other Ambulatory Visit (HOSPITAL_COMMUNITY): Payer: Self-pay

## 2021-03-26 ENCOUNTER — Other Ambulatory Visit (HOSPITAL_COMMUNITY): Payer: Self-pay

## 2021-03-26 MED ORDER — AMPHETAMINE-DEXTROAMPHETAMINE 10 MG PO TABS
10.0000 mg | ORAL_TABLET | Freq: Two times a day (BID) | ORAL | 0 refills | Status: DC
  Filled 2021-03-26: qty 60, 30d supply, fill #0

## 2021-04-18 ENCOUNTER — Other Ambulatory Visit: Payer: Self-pay | Admitting: Obstetrics and Gynecology

## 2021-04-18 ENCOUNTER — Other Ambulatory Visit (HOSPITAL_COMMUNITY): Payer: Self-pay

## 2021-04-18 DIAGNOSIS — Z09 Encounter for follow-up examination after completed treatment for conditions other than malignant neoplasm: Secondary | ICD-10-CM

## 2021-04-18 DIAGNOSIS — N6310 Unspecified lump in the right breast, unspecified quadrant: Secondary | ICD-10-CM

## 2021-04-24 ENCOUNTER — Other Ambulatory Visit (HOSPITAL_COMMUNITY): Payer: Self-pay

## 2021-05-02 ENCOUNTER — Telehealth: Payer: Self-pay | Admitting: Neurology

## 2021-05-02 NOTE — Telephone Encounter (Signed)
LVM + sent mychart msg requesting cb to r/s appt- MD out.

## 2021-05-03 ENCOUNTER — Encounter: Payer: Self-pay | Admitting: Internal Medicine

## 2021-05-04 ENCOUNTER — Other Ambulatory Visit: Payer: Self-pay | Admitting: Internal Medicine

## 2021-05-04 DIAGNOSIS — N6310 Unspecified lump in the right breast, unspecified quadrant: Secondary | ICD-10-CM | POA: Insufficient documentation

## 2021-05-08 ENCOUNTER — Ambulatory Visit: Payer: No Typology Code available for payment source

## 2021-05-08 ENCOUNTER — Other Ambulatory Visit: Payer: Self-pay | Admitting: Obstetrics and Gynecology

## 2021-05-08 ENCOUNTER — Other Ambulatory Visit: Payer: Self-pay

## 2021-05-08 ENCOUNTER — Ambulatory Visit
Admission: RE | Admit: 2021-05-08 | Discharge: 2021-05-08 | Disposition: A | Discharge status: Home / Self Care | Payer: No Typology Code available for payment source | Source: Ambulatory Visit | Attending: Obstetrics and Gynecology | Admitting: Obstetrics and Gynecology

## 2021-05-08 DIAGNOSIS — N6310 Unspecified lump in the right breast, unspecified quadrant: Secondary | ICD-10-CM

## 2021-05-08 DIAGNOSIS — Z09 Encounter for follow-up examination after completed treatment for conditions other than malignant neoplasm: Secondary | ICD-10-CM

## 2021-05-28 ENCOUNTER — Institutional Professional Consult (permissible substitution): Payer: No Typology Code available for payment source | Admitting: Neurology

## 2021-07-16 ENCOUNTER — Other Ambulatory Visit: Payer: Self-pay

## 2021-07-16 ENCOUNTER — Other Ambulatory Visit (HOSPITAL_COMMUNITY): Payer: Self-pay

## 2021-07-16 ENCOUNTER — Other Ambulatory Visit: Payer: Self-pay | Admitting: Internal Medicine

## 2021-07-16 DIAGNOSIS — M797 Fibromyalgia: Secondary | ICD-10-CM

## 2021-07-16 MED ORDER — ESTRADIOL 0.5 MG PO TABS
0.5000 mg | ORAL_TABLET | Freq: Every day | ORAL | 0 refills | Status: DC
  Filled 2021-07-16: qty 90, 90d supply, fill #0

## 2021-07-17 ENCOUNTER — Other Ambulatory Visit (HOSPITAL_COMMUNITY): Payer: Self-pay

## 2021-07-18 ENCOUNTER — Other Ambulatory Visit (HOSPITAL_COMMUNITY): Payer: Self-pay

## 2021-07-18 ENCOUNTER — Other Ambulatory Visit: Payer: Self-pay

## 2021-07-20 ENCOUNTER — Other Ambulatory Visit (HOSPITAL_COMMUNITY): Payer: Self-pay

## 2021-07-20 MED ORDER — CLONAZEPAM 1 MG PO TABS
0.5000 mg | ORAL_TABLET | Freq: Every evening | ORAL | 1 refills | Status: DC | PRN
  Filled 2021-07-20 – 2021-09-03 (×2): qty 90, 90d supply, fill #0
  Filled 2021-12-18: qty 90, 90d supply, fill #1

## 2021-07-20 MED ORDER — AMPHETAMINE-DEXTROAMPHETAMINE 10 MG PO TABS
10.0000 mg | ORAL_TABLET | Freq: Two times a day (BID) | ORAL | 0 refills | Status: DC
  Filled 2021-07-20: qty 60, 30d supply, fill #0

## 2021-09-03 ENCOUNTER — Other Ambulatory Visit (HOSPITAL_COMMUNITY): Payer: Self-pay

## 2021-09-03 MED ORDER — AMPHETAMINE-DEXTROAMPHETAMINE 10 MG PO TABS
10.0000 mg | ORAL_TABLET | ORAL | 0 refills | Status: DC
  Filled 2021-09-03: qty 49, 24d supply, fill #0
  Filled 2021-09-03: qty 11, 6d supply, fill #0

## 2021-09-04 ENCOUNTER — Other Ambulatory Visit (HOSPITAL_COMMUNITY): Payer: Self-pay

## 2021-09-05 ENCOUNTER — Encounter: Payer: Self-pay | Admitting: Gastroenterology

## 2021-10-03 ENCOUNTER — Encounter: Payer: No Typology Code available for payment source | Admitting: Internal Medicine

## 2021-10-17 ENCOUNTER — Ambulatory Visit (INDEPENDENT_AMBULATORY_CARE_PROVIDER_SITE_OTHER): Payer: No Typology Code available for payment source | Admitting: Internal Medicine

## 2021-10-17 ENCOUNTER — Encounter: Payer: Self-pay | Admitting: Internal Medicine

## 2021-10-17 ENCOUNTER — Other Ambulatory Visit (HOSPITAL_COMMUNITY): Payer: Self-pay

## 2021-10-17 VITALS — BP 104/66 | HR 91 | Temp 97.8°F | Ht 63.5 in | Wt 142.0 lb

## 2021-10-17 DIAGNOSIS — Z0000 Encounter for general adult medical examination without abnormal findings: Secondary | ICD-10-CM | POA: Diagnosis not present

## 2021-10-17 DIAGNOSIS — M797 Fibromyalgia: Secondary | ICD-10-CM

## 2021-10-17 DIAGNOSIS — T63481A Toxic effect of venom of other arthropod, accidental (unintentional), initial encounter: Secondary | ICD-10-CM | POA: Diagnosis not present

## 2021-10-17 DIAGNOSIS — K5904 Chronic idiopathic constipation: Secondary | ICD-10-CM | POA: Insufficient documentation

## 2021-10-17 DIAGNOSIS — R739 Hyperglycemia, unspecified: Secondary | ICD-10-CM | POA: Diagnosis not present

## 2021-10-17 DIAGNOSIS — Z1159 Encounter for screening for other viral diseases: Secondary | ICD-10-CM | POA: Insufficient documentation

## 2021-10-17 DIAGNOSIS — Z0001 Encounter for general adult medical examination with abnormal findings: Secondary | ICD-10-CM | POA: Insufficient documentation

## 2021-10-17 DIAGNOSIS — K635 Polyp of colon: Secondary | ICD-10-CM

## 2021-10-17 LAB — BASIC METABOLIC PANEL
BUN: 14 mg/dL (ref 6–23)
CO2: 29 mEq/L (ref 19–32)
Calcium: 9 mg/dL (ref 8.4–10.5)
Chloride: 105 mEq/L (ref 96–112)
Creatinine, Ser: 0.83 mg/dL (ref 0.40–1.20)
GFR: 79.32 mL/min (ref >60.00)
Glucose, Bld: 92 mg/dL (ref 70–99)
Potassium: 4 mEq/L (ref 3.5–5.1)
Sodium: 141 mEq/L (ref 135–145)

## 2021-10-17 LAB — CBC WITH DIFFERENTIAL/PLATELET
Basophils Absolute: 0 10*3/uL (ref 0.0–0.1)
Basophils Relative: 0.6 % (ref 0.0–3.0)
Eosinophils Absolute: 0.2 10*3/uL (ref 0.0–0.7)
Eosinophils Relative: 3.9 % (ref 0.0–5.0)
HCT: 40.1 % (ref 36.0–46.0)
Hemoglobin: 13.5 g/dL (ref 12.0–15.0)
Lymphocytes Relative: 32.9 % (ref 12.0–46.0)
Lymphs Abs: 1.7 10*3/uL (ref 0.7–4.0)
MCHC: 33.6 g/dL (ref 30.0–36.0)
MCV: 90.4 fl (ref 78.0–100.0)
Monocytes Absolute: 0.5 10*3/uL (ref 0.1–1.0)
Monocytes Relative: 9 % (ref 3.0–12.0)
Neutro Abs: 2.8 10*3/uL (ref 1.4–7.7)
Neutrophils Relative %: 53.6 % (ref 43.0–77.0)
Platelets: 244 10*3/uL (ref 150.0–400.0)
RBC: 4.43 Mil/uL (ref 3.87–5.11)
RDW: 13.3 % (ref 11.5–15.5)
WBC: 5.2 10*3/uL (ref 4.0–10.5)

## 2021-10-17 LAB — HEPATIC FUNCTION PANEL
ALT: 10 U/L (ref 0–35)
AST: 16 U/L (ref 0–37)
Albumin: 4.5 g/dL (ref 3.5–5.2)
Alkaline Phosphatase: 51 U/L (ref 39–117)
Bilirubin, Direct: 0.1 mg/dL (ref 0.0–0.3)
Total Bilirubin: 0.6 mg/dL (ref 0.2–1.2)
Total Protein: 6.7 g/dL (ref 6.0–8.3)

## 2021-10-17 LAB — LIPID PANEL
Cholesterol: 198 mg/dL (ref 0–200)
HDL: 73.9 mg/dL (ref >39.00)
LDL Cholesterol: 109 mg/dL — ABNORMAL HIGH (ref 0–99)
NonHDL: 124.47
Total CHOL/HDL Ratio: 3
Triglycerides: 75 mg/dL (ref 0.0–149.0)
VLDL: 15 mg/dL (ref 0.0–40.0)

## 2021-10-17 LAB — HEMOGLOBIN A1C: Hgb A1c MFr Bld: 5.9 % (ref 4.6–6.5)

## 2021-10-17 LAB — TSH: TSH: 1.26 u[IU]/mL (ref 0.35–5.50)

## 2021-10-17 MED ORDER — NORTRIPTYLINE HCL 25 MG PO CAPS
50.0000 mg | ORAL_CAPSULE | Freq: Every day | ORAL | 1 refills | Status: DC
  Filled 2021-10-17: qty 180, 90d supply, fill #0
  Filled 2022-01-16: qty 180, 90d supply, fill #1

## 2021-10-17 MED ORDER — EPINEPHRINE 0.3 MG/0.3ML IJ SOAJ
0.3000 mg | INTRAMUSCULAR | 3 refills | Status: DC | PRN
  Filled 2021-10-17: qty 2, 30d supply, fill #0

## 2021-10-17 NOTE — Progress Notes (Signed)
[facility]  ? ?Subjective:  ?Patient ID: Janice Spencer, female    DOB: 06/27/1966  Age: 56 y.o. MRN: [id number] ? ?CC: Annual Exam ? ? ?HPI ?Janice Spencer presents for a CPX and f/up -  ? ?Janice Spencer continues to complain of intermittent constipation.  Janice Spencer denies abdominal pain, nausea, vomiting, or blood in her stool.  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  Janice Spencer continues to have stiffness in her joints and was seen by rheumatologist within the last year. ? ?Outpatient Medications Prior to Visit  ?Medication Sig Dispense Refill  ? amphetamine-dextroamphetamine (ADDERALL) 10 MG tablet Take 1 tablet (10 mg total) by mouth up to 2 (two) times daily (At least 6 hours apart) for focus and inattention. 60 tablet 0  ? amphetamine-dextroamphetamine (ADDERALL) 10 MG tablet Take 1 tablet (10 mg total) by mouth as often as 2 times a day (at least 6 hours apart) for focus and inattention. 60 tablet 0  ? clonazePAM (KLONOPIN) 1 MG tablet Take 0.5-1 tablets (0.5-1 mg total) by mouth at bedtime as needed for sleep 90 tablet 1  ? Crisaborole (EUCRISA) 2 % OINT May use Eucrisa twice a day for dyshidrotic eczema on hands if needed. 100 g 1  ? estradiol (ESTRACE) 0.5 MG tablet TAKE 1 TABLET BY MOUTH ONCE DAILY 90 tablet 0  ? VITAMIN D PO Take 3,000 Units by mouth daily.    ? amphetamine-dextroamphetamine (ADDERALL) 10 MG tablet Take 1 tablet by mouth as often as twice a day (At least 6 hours apart) for focus and inattention. 60 tablet 0  ? cyclobenzaprine (FLEXERIL) 10 MG tablet Take 1 tablet (10 mg total) by mouth at bedtime as needed for muscle spasms. For muscle pain and insomnia 30 tablet 0  ? estradiol (ESTRACE) 0.5 MG tablet TAKE 1 TABLET BY MOUTH ONCE DAILY 90 tablet 4  ? fluocinonide cream (LIDEX) 2.35 % Apply 1 application topically 2 (two) times daily. (Patient taking differently: Apply 1 application. topically as needed.) 60 g 1  ? hydrOXYzine (ATARAX/VISTARIL) 25 MG tablet Take 1 tablet (25 mg total)  by mouth every 8 (eight) hours as needed. 90 tablet 0  ? nortriptyline (PAMELOR) 25 MG capsule Take 2 capsules (50 mg total) by mouth at bedtime. 180 capsule 0  ? Ascorbic Acid (VITAMIN C PO) Take 1,000 mg by mouth as needed.    ? estradiol (ESTRACE) 0.5 MG tablet TAKE 1 TABLET BY MOUTH ONCE DAILY 90 tablet 1  ? ?No facility-administered medications prior to visit.  ? ? ?ROS ?Review of Systems  ?Constitutional:  Negative for appetite change, diaphoresis, fatigue and fever.  ?HENT: Negative.    ?Eyes: Negative.   ?Respiratory: Negative.  Negative for cough, chest tightness and shortness of breath.   ?Cardiovascular: Negative.  Negative for chest pain and leg swelling.  ?Gastrointestinal:  Positive for constipation. Negative for abdominal pain, blood in stool, nausea and vomiting.  ?Endocrine: Negative.   ?Genitourinary: Negative.   ?Musculoskeletal:  Positive for arthralgias and myalgias. Negative for gait problem and neck pain.  ?Neurological: Negative.  Negative for dizziness, weakness, light-headedness and headaches.  ?Hematological:  Negative for adenopathy. Does not bruise/bleed easily.  ?Psychiatric/Behavioral: Negative.    ? ?Objective:  ?BP 104/66 (BP Location: Right Arm, Patient Position: Sitting, Cuff Size: Large)   Pulse 91   Temp 97.8 ?F (36.6 ?C) (Oral)   Ht 5' 3.5" (1.613 m)   Wt 142 lb (64.4 kg)   SpO2 98%  BMI 24.76 kg/m?  ? ?BP Readings from Last 3 Encounters:  ?10/17/21 104/66  ?01/01/21 110/73  ?06/07/20 126/72  ? ? ?Wt Readings from Last 3 Encounters:  ?10/17/21 142 lb (64.4 kg)  ?01/01/21 134 lb (60.8 kg)  ?06/07/20 142 lb (64.4 kg)  ? ? ?Physical Exam ?Vitals reviewed.  ?Constitutional:   ?   Appearance: Normal appearance.  ?HENT:  ?   Mouth/Throat:  ?   Mouth: Mucous membranes are moist.  ?Eyes:  ?   General: No scleral icterus. ?   Conjunctiva/sclera: Conjunctivae normal.  ?Cardiovascular:  ?   Rate and Rhythm: Normal rate and regular rhythm.  ?   Heart sounds: No murmur  heard. ?Pulmonary:  ?   Effort: Pulmonary effort is normal.  ?   Breath sounds: No stridor. No wheezing, rhonchi or rales.  ?Abdominal:  ?   General: Abdomen is flat.  ?   Palpations: There is no mass.  ?   Tenderness: There is no abdominal tenderness. There is no guarding or rebound.  ?   Hernia: No hernia is present.  ?Musculoskeletal:     ?   General: Normal range of motion.  ?   Cervical back: Neck supple.  ?   Right lower leg: No edema.  ?   Left lower leg: No edema.  ?Lymphadenopathy:  ?   Cervical: No cervical adenopathy.  ?Skin: ?   General: Skin is warm.  ?   Findings: No lesion or rash.  ?Neurological:  ?   General: No focal deficit present.  ?   Mental Status: Janice Spencer is alert.  ?Psychiatric:     ?   Mood and Affect: Mood normal.     ?   Behavior: Behavior normal.  ? ? ?Lab Results  ?Component Value Date  ? WBC 5.2 10/17/2021  ? HGB 13.5 10/17/2021  ? HCT 40.1 10/17/2021  ? PLT 244.0 10/17/2021  ? GLUCOSE 92 10/17/2021  ? CHOL 198 10/17/2021  ? TRIG 75.0 10/17/2021  ? HDL 73.90 10/17/2021  ? LDLCALC 109 (H) 10/17/2021  ? ALT 10 10/17/2021  ? AST 16 10/17/2021  ? NA 141 10/17/2021  ? K 4.0 10/17/2021  ? CL 105 10/17/2021  ? CREATININE 0.83 10/17/2021  ? BUN 14 10/17/2021  ? CO2 29 10/17/2021  ? TSH 1.26 10/17/2021  ? HGBA1C 5.9 10/17/2021  ? ? ?MM DIAG BREAST W/IMPLANT TOMO UNI R ? ?Result Date: 05/08/2021 ?CLINICAL DATA:  56 year old with a strong family history breast cancer (mother at age 48 and 2 maternal cousins) who had likely benign (BI-RADS 3) findings in the RIGHT breast in April, 2022. A possible mass in the subareolar location likely corresponded to benign fibrocystic changes on ultrasound, though the patient requested stereotactic biopsy for definitive tissue diagnosis; however, at the time of biopsy, the mass could not be reproduced. A subsequent abbreviated Breast MRI on 11/17/2020 demonstrated RIGHT breast cysts but no abnormal enhancement or suspicious mass. Janice Spencer presents now for short-term  interval follow-up. EXAM: DIGITAL DIAGNOSTIC UNILATERAL RIGHT MAMMOGRAM WITH IMPLANTS, CAD AND TOMOSYNTHESIS TECHNIQUE: Right digital diagnostic mammography and breast tomosynthesis was performed. The images were evaluated with computer-aided detection. Standard and/or implant displaced views were performed. COMPARISON:  Previous exam(s). ACR Breast Density Category c: The breast tissue is heterogeneously dense, which may obscure small masses. FINDINGS: The patient has retropectoral saline implants. Full field CC and MLO views and implant displaced CC and MLO views with tomosynthesis were obtained. The mass in the subareolar location questioned  on the screening mammogram 10/09/2020 is inconspicuous on the current examination. No findings suspicious for malignancy. IMPRESSION: No mammographic evidence of malignancy involving the RIGHT breast. The mass questioned in the subareolar location on the 10/09/2020 mammogram is inconspicuous currently. RECOMMENDATION: Annual BILATERAL screening mammography which is due in April, 2023. I have discussed the findings and recommendations with the patient. If applicable, a reminder letter will be sent to the patient regarding the next appointment. BI-RADS CATEGORY  2: Benign. Electronically Signed   By: Evangeline Dakin M.D.   On: 05/08/2021 13:21 ? ? ?Assessment & Plan:  ? ?Alvera was seen today for annual exam. ? ?Diagnoses and all orders for this visit: ? ?Chronic hyperglycemia- Her A1c is normal. ?-     Basic metabolic panel; Future ?-     Hemoglobin A1c; Future ?-     Hemoglobin A1c ?-     Basic metabolic panel ? ?Encounter for general adult medical examination with abnormal findings- Exam completed, labs reviewed, vaccines reviewed; Janice Spencer refused a shingles vaccine, cancer screenings addressed, patient education was given. ?-     Lipid panel; Future ?-     Hepatitis C antibody; Future ?-     HIV Antibody (routine testing w rflx); Future ?-     HIV Antibody (routine testing w  rflx) ?-     Hepatitis C antibody ?-     Lipid panel ? ?Fibromyalgia muscle pain ?-     nortriptyline (PAMELOR) 25 MG capsule; Take 2 capsules (50 mg total) by mouth at bedtime. ? ?Chronic idiopathic constipation- Labs are negat

## 2021-10-17 NOTE — Patient Instructions (Signed)

## 2021-10-18 LAB — HEPATITIS C ANTIBODY
Hepatitis C Ab: NONREACTIVE
SIGNAL TO CUT-OFF: 0.16 (ref <1.00)

## 2021-10-18 LAB — HIV ANTIBODY (ROUTINE TESTING W REFLEX): HIV 1&2 Ab, 4th Generation: NONREACTIVE

## 2021-10-19 ENCOUNTER — Encounter: Payer: Self-pay | Admitting: Internal Medicine

## 2021-10-23 ENCOUNTER — Other Ambulatory Visit (HOSPITAL_COMMUNITY): Payer: Self-pay

## 2021-10-23 MED ORDER — ESTRADIOL 0.5 MG PO TABS
0.5000 mg | ORAL_TABLET | Freq: Every day | ORAL | 4 refills | Status: DC
  Filled 2021-10-23: qty 90, 90d supply, fill #0
  Filled 2022-01-16: qty 90, 90d supply, fill #1
  Filled 2022-04-15: qty 90, 90d supply, fill #2
  Filled 2022-06-21 – 2022-06-26 (×2): qty 90, 90d supply, fill #3
  Filled 2022-09-18: qty 90, 90d supply, fill #4

## 2021-10-23 MED ORDER — AMPHETAMINE-DEXTROAMPHETAMINE 10 MG PO TABS
10.0000 mg | ORAL_TABLET | Freq: Two times a day (BID) | ORAL | 0 refills | Status: DC
  Filled 2021-10-23: qty 60, 30d supply, fill #0

## 2021-11-20 ENCOUNTER — Encounter: Payer: Self-pay | Admitting: Gastroenterology

## 2021-11-29 ENCOUNTER — Ambulatory Visit (AMBULATORY_SURGERY_CENTER): Payer: No Typology Code available for payment source | Admitting: *Deleted

## 2021-11-29 ENCOUNTER — Other Ambulatory Visit (HOSPITAL_COMMUNITY): Payer: Self-pay

## 2021-11-29 VITALS — Ht 63.5 in | Wt 142.0 lb

## 2021-11-29 DIAGNOSIS — Z1211 Encounter for screening for malignant neoplasm of colon: Secondary | ICD-10-CM

## 2021-11-29 MED ORDER — ONDANSETRON HCL 4 MG PO TABS
4.0000 mg | ORAL_TABLET | ORAL | 0 refills | Status: DC
  Filled 2021-11-29: qty 2, 1d supply, fill #0

## 2021-11-29 MED ORDER — NA SULFATE-K SULFATE-MG SULF 17.5-3.13-1.6 GM/177ML PO SOLN
1.0000 | ORAL | 0 refills | Status: DC
  Filled 2021-11-29: qty 354, 1d supply, fill #0

## 2021-11-29 NOTE — Progress Notes (Signed)
Patient is here in-person for PV. Patient denies any allergies to eggs or soy. Patient denies any problems with anesthesia/sedation. Patient is not on any oxygen at home. Patient is not taking any diet/weight loss medications or blood thinners. Patient is aware of our care-partner policy. Pt denies constipation unless she is at work.  EMMI education assigned to the patient for the procedure, sent to [facility].

## 2021-12-18 ENCOUNTER — Other Ambulatory Visit (HOSPITAL_COMMUNITY): Payer: Self-pay

## 2021-12-19 ENCOUNTER — Other Ambulatory Visit (HOSPITAL_COMMUNITY): Payer: Self-pay

## 2021-12-23 ENCOUNTER — Encounter: Payer: Self-pay | Admitting: Certified Registered Nurse Anesthetist

## 2021-12-24 ENCOUNTER — Other Ambulatory Visit (HOSPITAL_COMMUNITY): Payer: Self-pay

## 2021-12-25 ENCOUNTER — Encounter: Payer: Self-pay | Admitting: Gastroenterology

## 2021-12-25 ENCOUNTER — Other Ambulatory Visit (HOSPITAL_COMMUNITY): Payer: Self-pay

## 2021-12-27 ENCOUNTER — Ambulatory Visit (AMBULATORY_SURGERY_CENTER): Payer: No Typology Code available for payment source | Admitting: Gastroenterology

## 2021-12-27 ENCOUNTER — Encounter: Payer: Self-pay | Admitting: Gastroenterology

## 2021-12-27 VITALS — BP 94/48 | HR 73 | Temp 98.2°F | Resp 16 | Ht 63.5 in | Wt 142.0 lb

## 2021-12-27 DIAGNOSIS — Z1211 Encounter for screening for malignant neoplasm of colon: Secondary | ICD-10-CM

## 2021-12-27 DIAGNOSIS — K635 Polyp of colon: Secondary | ICD-10-CM | POA: Diagnosis not present

## 2021-12-27 DIAGNOSIS — K5730 Diverticulosis of large intestine without perforation or abscess without bleeding: Secondary | ICD-10-CM

## 2021-12-27 DIAGNOSIS — D122 Benign neoplasm of ascending colon: Secondary | ICD-10-CM | POA: Diagnosis not present

## 2021-12-27 DIAGNOSIS — D120 Benign neoplasm of cecum: Secondary | ICD-10-CM

## 2021-12-27 MED ORDER — SODIUM CHLORIDE 0.9 % IV SOLN: 500.0000 mL | Freq: Once | INTRAVENOUS | Status: DC

## 2021-12-27 NOTE — Progress Notes (Signed)
Pt's states no medical or surgical changes since previsit or office visit. 

## 2021-12-27 NOTE — Progress Notes (Signed)
GASTROENTEROLOGY PROCEDURE H&P NOTE   Primary Care Physician: Janith Lima, MD    Reason for Procedure:  Colon Cancer screening  Plan:    Colonoscopy  Patient is appropriate for endoscopic procedure(s) in the ambulatory ([facility]) setting.  The nature of the procedure, as well as the risks, benefits, and alternatives were carefully and thoroughly reviewed with the patient. Ample time for discussion and questions allowed. The patient understood, was satisfied, and agreed to proceed.     HPI: Janice Spencer is a 56 y.o. female who presents for colonoscopy for routine Colon Cancer screening.  No active GI symptoms.  No known family history of colon cancer or related malignancy.  Patient is otherwise without complaints or active issues today.  Last colonoscopy was 05/2011 and n/f 3 mm sigmoid HP. Otherwise normal colon with bxs negative for MC.   Past Medical History:  Diagnosis Date   Anxiety    Depression    Eczema    Endometriosis    Fibromyalgia 2012   IBS (irritable bowel syndrome)    Insomnia    Urticaria     Past Surgical History:  Procedure Laterality Date   CHOLECYSTECTOMY     COLONOSCOPY  05/13/2011   Dr.Brodie   nasal bone spur removed     TONSILLECTOMY     TYMPANOSTOMY TUBE PLACEMENT     VAGINAL HYSTERECTOMY      Prior to Admission medications   Medication Sig Start Date End Date Taking? Authorizing Provider  amphetamine-dextroamphetamine (ADDERALL) 10 MG tablet Take 1 tablet (10 mg total) by mouth as often as 2 times a day (at least 6 hours apart) for focus and inattention. 09/03/21  Yes   ondansetron (ZOFRAN) 4 MG tablet Take 1 tablet (4 mg total) by mouth as directed. Take one Zofran pill 30-60 minutes before each colonoscopy prep dose 11/29/21  Yes Emmaclaire Switala V, DO  clonazePAM (KLONOPIN) 1 MG tablet Take 0.5-1 tablets (0.5-1 mg total) by mouth at bedtime as needed for sleep 07/19/21     Crisaborole (EUCRISA) 2 % OINT May use Eucrisa twice a day  for dyshidrotic eczema on hands if needed. 03/17/20   Garnet Sierras, DO  EPINEPHrine (EPIPEN 2-PAK) 0.3 mg/0.3 mL IJ SOAJ injection Inject 0.3 mg into the muscle as needed for anaphylaxis. Patient not taking: Reported on 12/27/2021 10/17/21   Janith Lima, MD  estradiol (ESTRACE) 0.5 MG tablet Take 1 tablet (0.5 mg total) by mouth daily. 10/23/21     nortriptyline (PAMELOR) 25 MG capsule Take 2 capsules (50 mg total) by mouth at bedtime. 10/17/21   Janith Lima, MD  VITAMIN D PO Take 3,000 Units by mouth daily.    [provider]    Current Outpatient Medications  Medication Sig Dispense Refill   amphetamine-dextroamphetamine (ADDERALL) 10 MG tablet Take 1 tablet (10 mg total) by mouth as often as 2 times a day (at least 6 hours apart) for focus and inattention. 60 tablet 0   ondansetron (ZOFRAN) 4 MG tablet Take 1 tablet (4 mg total) by mouth as directed. Take one Zofran pill 30-60 minutes before each colonoscopy prep dose 2 tablet 0   clonazePAM (KLONOPIN) 1 MG tablet Take 0.5-1 tablets (0.5-1 mg total) by mouth at bedtime as needed for sleep 90 tablet 1   Crisaborole (EUCRISA) 2 % OINT May use Eucrisa twice a day for dyshidrotic eczema on hands if needed. 100 g 1   EPINEPHrine (EPIPEN 2-PAK) 0.3 mg/0.3 mL IJ SOAJ  injection Inject 0.3 mg into the muscle as needed for anaphylaxis. (Patient not taking: Reported on 12/27/2021) 2 each 3   estradiol (ESTRACE) 0.5 MG tablet Take 1 tablet (0.5 mg total) by mouth daily. 90 tablet 4   nortriptyline (PAMELOR) 25 MG capsule Take 2 capsules (50 mg total) by mouth at bedtime. 180 capsule 1   VITAMIN D PO Take 3,000 Units by mouth daily.     Current Facility-Administered Medications  Medication Dose Route Frequency Provider Last Rate Last Admin   0.9 %  sodium chloride infusion  500 mL Intravenous Once Maitland Muhlbauer V, DO        Allergies as of 12/27/2021 - Review Complete 12/27/2021  Allergen Reaction Noted   Cymbalta [duloxetine hcl]   10/18/2011   Other Anaphylaxis 03/17/2020   Flagyl [metronidazole]  09/17/2013   Belsomra [suvorexant] Other (See Comments) 04/08/2016   Morphine Hives and Itching    Permethrin  02/28/2020   Codeine Rash    Erythromycin base Rash 03/17/2020   Penicillins Rash     Family History  Problem Relation Age of Onset   Hypertension Mother    Goiter Mother    Breast cancer Mother    Heart disease Father    Coronary artery disease Brother    Coronary artery disease Brother    Coronary artery disease Maternal Grandmother    Allergic rhinitis Maternal Grandmother    Angioedema Maternal Grandmother    Uterine cancer Maternal Grandmother    Lung cancer Maternal Grandmother    Healthy Daughter    Healthy Son    Throat cancer Maternal Uncle    Lung cancer Maternal Uncle    Cancer Neg Hx    Alcohol abuse Neg Hx    Stroke Neg Hx    Colon cancer Neg Hx    Esophageal cancer Neg Hx    Rectal cancer Neg Hx    Stomach cancer Neg Hx     Social History   Socioeconomic History   Marital status: Divorced    Spouse name: Not on file   Number of children: Not on file   Years of education: Not on file   Highest education level: Not on file  Occupational History   Not on file  Tobacco Use   Smoking status: Former    Packs/day: 0.25    Types: Cigarettes    Quit date: 05/12/2008    Years since quitting: 13.6   Smokeless tobacco: Never  Vaping Use   Vaping Use: Never used  Substance and Sexual Activity   Alcohol use: Not Currently   Drug use: No   Sexual activity: Not Currently  Other Topics Concern   Not on file  Social History Narrative   Not on file   Social Determinants of Health   Financial Resource Strain: Not on file  Food Insecurity: Not on file  Transportation Needs: Not on file  Physical Activity: Not on file  Stress: Not on file  Social Connections: Not on file  Intimate Partner Violence: Not on file    Physical Exam: Vital signs in last 24 hours: '@BP'$  99/60    Pulse 82   Temp 98.2 F (36.8 C)   Ht 5' 3.5" (1.613 m)   Wt 142 lb (64.4 kg)   SpO2 100%   BMI 24.76 kg/m  GEN: NAD EYE: Sclerae anicteric ENT: MMM CV: Non-tachycardic Pulm: CTA b/l GI: Soft, NT/ND NEURO:  Alert & Oriented x [place], DO [facility]  12/27/2021 8:32 AM

## 2021-12-27 NOTE — Op Note (Signed)
[facility] Patient Name: Janice Spencer Procedure Date: 12/27/2021 8:34 AM MRN: [id number] Endoscopist: Gerrit Heck , MD Age: 56 Referring MD:  Date of Birth: April 01, 1966 Gender: Female Account #: [id number] Procedure:                Colonoscopy Indications:              Screening for colorectal malignant neoplasm (last                            colonoscopy was 10 years ago)                           Last colonoscopy was 05/2011 and n/f 3 mm sigmoid                            HP. Otherwise normal colon with bxs negative for [facility]. Medicines:                Monitored Anesthesia Care Procedure:                Pre-Anesthesia Assessment:                           - Prior to the procedure, a History and Physical                            was performed, and patient medications and                            allergies were reviewed. The patient's tolerance of                            previous anesthesia was also reviewed. The risks                            and benefits of the procedure and the sedation                            options and risks were discussed with the patient.                            All questions were answered, and informed consent                            was obtained. Prior Anticoagulants: The patient has                            taken no previous anticoagulant or antiplatelet                            agents. ASA Grade Assessment: II - A patient with                            mild systemic disease. After reviewing the risks  and benefits, the patient was deemed in                            satisfactory condition to undergo the procedure.                           After obtaining informed consent, the colonoscope                            was passed under direct vision. Throughout the                            procedure, the patient's blood pressure, pulse, and                            oxygen saturations were  monitored continuously. The                            PCF-HQ190L Colonoscope was introduced through the                            anus and advanced to the the cecum, identified by                            the appendiceal orifice. The colonoscopy was                            performed without difficulty. The patient tolerated                            the procedure well. The quality of the bowel                            preparation was good. The ileocecal valve,                            appendiceal orifice, and rectum were photographed. Scope In: 8:39:08 AM Scope Out: 9:08:55 AM Scope Withdrawal Time: 0 hours 25 minutes 56 seconds  Total Procedure Duration: 0 hours 29 minutes 47 seconds  Findings:                 The perianal and digital rectal examinations were                            normal.                           A 6 mm polyp was found in the cecum. The polyp was                            flat with adherent mucus cap. The polyp was removed                            with a cold snare. Resection and retrieval were  complete. Estimated blood loss was minimal.                           A 15 mm polyp was found in the ascending colon. The                            polyp was flat with adherent mucus cap. The polyp                            was removed with a cold snare. Resection and                            retrieval were complete. Estimated blood loss was                            minimal.                           Two polyps were found in the ascending colon. The                            polyps were 6 mm semi-pedunculated and 8 mm flat                            polyp. These polyps were removed with a cold snare.                            Resection and retrieval were complete. Estimated                            blood loss was minimal.                           A few small-mouthed diverticula were found in the                             sigmoid colon.                           The retroflexed view of the distal rectum and anal                            verge was normal and showed no anal or rectal                            abnormalities. Complications:            No immediate complications. Estimated Blood Loss:     Estimated blood loss was minimal. Impression:               - One 6 mm polyp in the cecum, removed with a cold                            snare. Resected and retrieved.                           -  One 15 mm polyp in the ascending colon, removed                            with a cold snare. Resected and retrieved.                           - Two 6 to 8 mm polyps in the ascending colon,                            removed with a cold snare. Resected and retrieved.                           - Diverticulosis in the sigmoid colon.                           - The distal rectum and anal verge are normal on                            retroflexion view. Recommendation:           - Patient has a contact number available for                            emergencies. The signs and symptoms of potential                            delayed complications were discussed with the                            patient. Return to normal activities tomorrow.                            Written discharge instructions were provided to the                            patient.                           - Resume previous diet.                           - Continue present medications.                           - Await pathology results.                           - Repeat colonoscopy for surveillance based on                            pathology results.                           - Return to GI office PRN. Gerrit Heck, MD 12/27/2021 9:18:37 AM

## 2021-12-27 NOTE — Patient Instructions (Signed)
Please read handouts provided. Continue present medications. Await pathology results. Return to GI office as needed.    YOU HAD AN ENDOSCOPIC PROCEDURE TODAY AT [facility]:   Refer to the procedure report that was given to you for any specific questions about what was found during the examination.  If the procedure report does not answer your questions, please call your gastroenterologist to clarify.  If you requested that your care partner not be given the details of your procedure findings, then the procedure report has been included in a sealed envelope for you to review at your convenience later.  YOU SHOULD EXPECT: Some feelings of bloating in the abdomen. Passage of more gas than usual.  Walking can help get rid of the air that was put into your GI tract during the procedure and reduce the bloating. If you had a lower endoscopy (such as a colonoscopy or flexible sigmoidoscopy) you may notice spotting of blood in your stool or on the toilet paper. If you underwent a bowel prep for your procedure, you may not have a normal bowel movement for a few days.  Please Note:  You might notice some irritation and congestion in your nose or some drainage.  This is from the oxygen used during your procedure.  There is no need for concern and it should clear up in a day or so.  SYMPTOMS TO REPORT IMMEDIATELY:  Following lower endoscopy (colonoscopy or flexible sigmoidoscopy):  Excessive amounts of blood in the stool  Significant tenderness or worsening of abdominal pains  Swelling of the abdomen that is new, acute  Fever of 100F or higher   For urgent or emergent issues, a gastroenterologist can be reached at any hour by calling [phone]. Do not use MyChart messaging for urgent concerns.    DIET:  We do recommend a small meal at first, but then you may proceed to your regular diet.  Drink plenty of fluids but you should avoid alcoholic beverages for 24 hours.  ACTIVITY:   You should plan to take it easy for the rest of today and you should NOT DRIVE or use heavy machinery until tomorrow (because of the sedation medicines used during the test).    FOLLOW UP: Our staff will call the number listed on your records 24-72 hours following your procedure to check on you and address any questions or concerns that you may have regarding the information given to you following your procedure. If we do not reach you, we will leave a message.  We will attempt to reach you two times.  During this call, we will ask if you have developed any symptoms of COVID 19. If you develop any symptoms (ie: fever, flu-like symptoms, shortness of breath, cough etc.) before then, please call [phone].  If you test positive for Covid 19 in the 2 weeks post procedure, please call and report this information to [place].    If any biopsies were taken you will be contacted by phone or by letter within the next 1-3 weeks.  Please call us at [phone] if you have not heard about the biopsies in 3 weeks.    SIGNATURES/CONFIDENTIALITY: You and/or your care partner have signed paperwork which will be entered into your electronic medical record.  These signatures attest to the fact that that the information above on your After Visit Summary has been reviewed and is understood.  Full responsibility of the confidentiality of this discharge information lies with you and/or your care-partner.

## 2021-12-27 NOTE — Progress Notes (Signed)
Called to room to assist during endoscopic procedure.  Patient ID and intended procedure confirmed with present staff. Received instructions for my participation in the procedure from the performing physician.  

## 2021-12-28 ENCOUNTER — Telehealth: Payer: Self-pay | Admitting: *Deleted

## 2021-12-28 ENCOUNTER — Other Ambulatory Visit (HOSPITAL_COMMUNITY): Payer: Self-pay

## 2021-12-28 MED ORDER — AMPHETAMINE-DEXTROAMPHETAMINE 10 MG PO TABS
10.0000 mg | ORAL_TABLET | Freq: Two times a day (BID) | ORAL | 0 refills | Status: DC
  Filled 2021-12-28: qty 60, 30d supply, fill #0

## 2022-01-11 ENCOUNTER — Encounter: Payer: Self-pay | Admitting: Gastroenterology

## 2022-01-16 ENCOUNTER — Other Ambulatory Visit (HOSPITAL_COMMUNITY): Payer: Self-pay

## 2022-03-18 ENCOUNTER — Other Ambulatory Visit (HOSPITAL_COMMUNITY): Payer: Self-pay

## 2022-03-18 MED ORDER — CLONAZEPAM 1 MG PO TABS
0.5000 mg | ORAL_TABLET | Freq: Every evening | ORAL | 1 refills | Status: DC | PRN
  Filled 2022-03-18 – 2022-03-20 (×2): qty 90, 90d supply, fill #0
  Filled 2022-06-21: qty 90, 90d supply, fill #1

## 2022-03-18 MED ORDER — AMPHETAMINE-DEXTROAMPHETAMINE 10 MG PO TABS
10.0000 mg | ORAL_TABLET | ORAL | 0 refills | Status: DC
  Filled 2022-03-18: qty 60, 30d supply, fill #0

## 2022-03-20 ENCOUNTER — Other Ambulatory Visit (HOSPITAL_COMMUNITY): Payer: Self-pay

## 2022-04-15 ENCOUNTER — Other Ambulatory Visit: Payer: Self-pay | Admitting: Internal Medicine

## 2022-04-15 ENCOUNTER — Other Ambulatory Visit (HOSPITAL_COMMUNITY): Payer: Self-pay

## 2022-04-15 DIAGNOSIS — M797 Fibromyalgia: Secondary | ICD-10-CM

## 2022-04-15 MED ORDER — NORTRIPTYLINE HCL 25 MG PO CAPS
50.0000 mg | ORAL_CAPSULE | Freq: Every day | ORAL | 1 refills | Status: DC
  Filled 2022-04-15: qty 180, 90d supply, fill #0
  Filled 2022-09-18: qty 180, 90d supply, fill #1

## 2022-04-17 ENCOUNTER — Other Ambulatory Visit (HOSPITAL_COMMUNITY): Payer: Self-pay

## 2022-04-17 MED ORDER — AMPHETAMINE-DEXTROAMPHETAMINE 10 MG PO TABS
10.0000 mg | ORAL_TABLET | Freq: Two times a day (BID) | ORAL | 0 refills | Status: DC
  Filled 2022-04-17: qty 60, 30d supply, fill #0

## 2022-06-21 ENCOUNTER — Other Ambulatory Visit (HOSPITAL_COMMUNITY): Payer: Self-pay

## 2022-06-21 ENCOUNTER — Other Ambulatory Visit: Payer: Self-pay

## 2022-06-21 MED ORDER — AMPHETAMINE-DEXTROAMPHETAMINE 10 MG PO TABS
10.0000 mg | ORAL_TABLET | Freq: Two times a day (BID) | ORAL | 0 refills | Status: DC
  Filled 2022-06-21: qty 60, 30d supply, fill #0

## 2022-06-25 ENCOUNTER — Other Ambulatory Visit: Payer: Self-pay

## 2022-06-26 ENCOUNTER — Other Ambulatory Visit (HOSPITAL_COMMUNITY): Payer: Self-pay

## 2022-07-17 ENCOUNTER — Other Ambulatory Visit (HOSPITAL_COMMUNITY): Payer: Self-pay

## 2022-09-18 ENCOUNTER — Other Ambulatory Visit: Payer: Self-pay

## 2022-09-18 ENCOUNTER — Other Ambulatory Visit (HOSPITAL_COMMUNITY): Payer: Self-pay

## 2022-09-18 MED ORDER — AMPHETAMINE-DEXTROAMPHETAMINE 10 MG PO TABS
10.0000 mg | ORAL_TABLET | Freq: Two times a day (BID) | ORAL | 0 refills | Status: DC
  Filled 2022-09-18: qty 60, 30d supply, fill #0

## 2022-09-18 MED ORDER — CLONAZEPAM 1 MG PO TABS
1.0000 mg | ORAL_TABLET | Freq: Every evening | ORAL | 1 refills | Status: AC
  Filled 2022-09-18: qty 90, 90d supply, fill #0
  Filled 2022-12-24: qty 90, 90d supply, fill #1

## 2022-09-19 ENCOUNTER — Other Ambulatory Visit (HOSPITAL_COMMUNITY): Payer: Self-pay

## 2022-10-23 ENCOUNTER — Ambulatory Visit (INDEPENDENT_AMBULATORY_CARE_PROVIDER_SITE_OTHER): Payer: 59 | Admitting: Internal Medicine

## 2022-10-23 ENCOUNTER — Other Ambulatory Visit (HOSPITAL_COMMUNITY): Payer: Self-pay

## 2022-10-23 ENCOUNTER — Encounter: Payer: Self-pay | Admitting: Internal Medicine

## 2022-10-23 VITALS — BP 124/78 | HR 92 | Temp 98.1°F | Ht 63.5 in | Wt 132.0 lb

## 2022-10-23 DIAGNOSIS — T63481A Toxic effect of venom of other arthropod, accidental (unintentional), initial encounter: Secondary | ICD-10-CM | POA: Diagnosis not present

## 2022-10-23 DIAGNOSIS — Z0000 Encounter for general adult medical examination without abnormal findings: Secondary | ICD-10-CM | POA: Diagnosis not present

## 2022-10-23 DIAGNOSIS — R739 Hyperglycemia, unspecified: Secondary | ICD-10-CM | POA: Diagnosis not present

## 2022-10-23 DIAGNOSIS — Z124 Encounter for screening for malignant neoplasm of cervix: Secondary | ICD-10-CM

## 2022-10-23 DIAGNOSIS — Z0001 Encounter for general adult medical examination with abnormal findings: Secondary | ICD-10-CM

## 2022-10-23 LAB — BASIC METABOLIC PANEL
BUN: 12 mg/dL (ref 6–23)
CO2: 28 mEq/L (ref 19–32)
Calcium: 9.5 mg/dL (ref 8.4–10.5)
Chloride: 103 mEq/L (ref 96–112)
Creatinine, Ser: 0.79 mg/dL (ref 0.40–1.20)
GFR: 83.56 mL/min (ref >60.00)
Glucose, Bld: 100 mg/dL — ABNORMAL HIGH (ref 70–99)
Potassium: 4.3 mEq/L (ref 3.5–5.1)
Sodium: 139 mEq/L (ref 135–145)

## 2022-10-23 LAB — HEMOGLOBIN A1C: Hgb A1c MFr Bld: 5.7 % (ref 4.6–6.5)

## 2022-10-23 MED ORDER — EPINEPHRINE 0.3 MG/0.3ML IJ SOAJ
0.3000 mg | INTRAMUSCULAR | 3 refills | Status: DC | PRN
  Filled 2022-10-23: qty 2, 9d supply, fill #0

## 2022-10-23 NOTE — Patient Instructions (Signed)

## 2022-10-23 NOTE — Progress Notes (Signed)
Subjective:  Patient ID: Janice Spencer, female    DOB: 1965-12-12  Age: 57 y.o. MRN: [id number]  CC: Annual Exam   HPI Janice Spencer presents for a CPX and f/up ----  She walks 6 miles a day and has good endurance.  She denies chest pain, shortness of breath, diaphoresis, or edema.  Outpatient Medications Prior to Visit  Medication Sig Dispense Refill   amphetamine-dextroamphetamine (ADDERALL) 10 MG tablet Take 1 tablet (10 mg total) by mouth 2 (two) times daily.(AT LEAST 6 HOURS APART) for focus & attention 60 tablet 0   clonazePAM (KLONOPIN) 1 MG tablet Take 1/2 - 1 tablet by mouth at bedtime as needed for sleep 90 tablet 1   estradiol (ESTRACE) 0.5 MG tablet Take 1 tablet (0.5 mg total) by mouth daily. 90 tablet 4   nortriptyline (PAMELOR) 25 MG capsule Take 2 capsules (50 mg total) by mouth at bedtime. 180 capsule 1   VITAMIN D PO Take 3,000 Units by mouth daily.     amphetamine-dextroamphetamine (ADDERALL) 10 MG tablet Take 1 tablet (10 mg total) by mouth as often as 2 times a day (at least 6 hours apart) for focus and inattention. 60 tablet 0   amphetamine-dextroamphetamine (ADDERALL) 10 MG tablet Take 1 tablet (10 mg total) by mouth as often as twice a day ( at least 6 hours apart) for focus and inattention 60 tablet 0   clonazePAM (KLONOPIN) 1 MG tablet Take 0.5-1 tablets (0.5-1 mg total) by mouth at bedtime as needed for sleep 90 tablet 1   Crisaborole (EUCRISA) 2 % OINT May use Eucrisa twice a day for dyshidrotic eczema on hands if needed. 100 g 1   EPINEPHrine (EPIPEN 2-PAK) 0.3 mg/0.3 mL IJ SOAJ injection Inject 0.3 mg into the muscle as needed for anaphylaxis. 2 each 3   ondansetron (ZOFRAN) 4 MG tablet Take 1 tablet (4 mg total) by mouth as directed. Take one Zofran pill 30-60 minutes before each colonoscopy prep dose 2 tablet 0   No facility-administered medications prior to visit.    ROS Review of Systems  Constitutional: Negative.  Negative for appetite change,  diaphoresis, fatigue and unexpected weight change.  HENT: Negative.    Eyes: Negative.   Respiratory: Negative.  Negative for cough, chest tightness, shortness of breath and wheezing.   Cardiovascular:  Negative for chest pain, palpitations and leg swelling.  Gastrointestinal:  Negative for abdominal pain, constipation, diarrhea, nausea and vomiting.  Endocrine: Negative.   Genitourinary: Negative.  Negative for difficulty urinating.  Musculoskeletal: Negative.   Skin: Negative.   Neurological: Negative.  Negative for dizziness and weakness.  Hematological:  Negative for adenopathy. Does not bruise/bleed easily.  Psychiatric/Behavioral: Negative.      Objective:  BP 124/78 (BP Location: Left Arm, Patient Position: Sitting, Cuff Size: Normal)   Pulse 92   Temp 98.1 F (36.7 C) (Oral)   Ht 5' 3.5" (1.613 m)   Wt 132 lb (59.9 kg)   SpO2 99%   BMI 23.02 kg/m   BP Readings from Last 3 Encounters:  10/23/22 124/78  12/27/21 (!) 94/48  10/17/21 104/66    Wt Readings from Last 3 Encounters:  10/23/22 132 lb (59.9 kg)  12/27/21 142 lb (64.4 kg)  11/29/21 142 lb (64.4 kg)    Physical Exam Vitals reviewed.  Constitutional:      Appearance: Normal appearance.  HENT:     Nose: Nose normal.     Mouth/Throat:     Mouth: Mucous  membranes are moist.  Eyes:     General: No scleral icterus.    Conjunctiva/sclera: Conjunctivae normal.  Cardiovascular:     Rate and Rhythm: Normal rate and regular rhythm.     Heart sounds: No murmur heard. Pulmonary:     Effort: Pulmonary effort is normal.     Breath sounds: No stridor. No wheezing, rhonchi or rales.  Abdominal:     General: Abdomen is flat.     Palpations: There is no mass.     Tenderness: There is no abdominal tenderness. There is no guarding.     Hernia: No hernia is present.  Musculoskeletal:     Cervical back: Neck supple.     Right lower leg: No edema.     Left lower leg: Edema present.  Lymphadenopathy:     Cervical:  No cervical adenopathy.  Skin:    General: Skin is warm and dry.  Neurological:     General: No focal deficit present.     Mental Status: She is alert. Mental status is at baseline.  Psychiatric:        Mood and Affect: Mood normal.        Behavior: Behavior normal.     Lab Results  Component Value Date   WBC 5.2 10/17/2021   HGB 13.5 10/17/2021   HCT 40.1 10/17/2021   PLT 244.0 10/17/2021   GLUCOSE 100 (H) 10/23/2022   CHOL 198 10/17/2021   TRIG 75.0 10/17/2021   HDL 73.90 10/17/2021   LDLCALC 109 (H) 10/17/2021   ALT 10 10/17/2021   AST 16 10/17/2021   NA 139 10/23/2022   K 4.3 10/23/2022   CL 103 10/23/2022   CREATININE 0.79 10/23/2022   BUN 12 10/23/2022   CO2 28 10/23/2022   TSH 1.26 10/17/2021   HGBA1C 5.7 10/23/2022    MM DIAG BREAST W/IMPLANT TOMO UNI R  Result Date: 05/08/2021 CLINICAL DATA:  57 year old with a strong family history breast cancer (mother at age 49 and 2 maternal cousins) who had likely benign (BI-RADS 3) findings in the RIGHT breast in April, 2022. A possible mass in the subareolar location likely corresponded to benign fibrocystic changes on ultrasound, though the patient requested stereotactic biopsy for definitive tissue diagnosis; however, at the time of biopsy, the mass could not be reproduced. A subsequent abbreviated Breast MRI on 11/17/2020 demonstrated RIGHT breast cysts but no abnormal enhancement or suspicious mass. She presents now for short-term interval follow-up. EXAM: DIGITAL DIAGNOSTIC UNILATERAL RIGHT MAMMOGRAM WITH IMPLANTS, CAD AND TOMOSYNTHESIS TECHNIQUE: Right digital diagnostic mammography and breast tomosynthesis was performed. The images were evaluated with computer-aided detection. Standard and/or implant displaced views were performed. COMPARISON:  Previous exam(s). ACR Breast Density Category c: The breast tissue is heterogeneously dense, which may obscure small masses. FINDINGS: The patient has retropectoral saline implants.  Full field CC and MLO views and implant displaced CC and MLO views with tomosynthesis were obtained. The mass in the subareolar location questioned on the screening mammogram 10/09/2020 is inconspicuous on the current examination. No findings suspicious for malignancy. IMPRESSION: No mammographic evidence of malignancy involving the RIGHT breast. The mass questioned in the subareolar location on the 10/09/2020 mammogram is inconspicuous currently. RECOMMENDATION: Annual BILATERAL screening mammography which is due in April, 2023. I have discussed the findings and recommendations with the patient. If applicable, a reminder letter will be sent to the patient regarding the next appointment. BI-RADS CATEGORY  2: Benign. Electronically Signed   By: Kayren Eaves.D.  On: 05/08/2021 13:21   Assessment & Plan:   Chronic hyperglycemia- Her A1c is down to 5.3%. 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  Dispense: 2 each; Refill: 3  Encounter for general adult medical examination with abnormal findings- Exam completed, labs reviewed, vaccines reviewed, cancer screenings addressed, patient education was given.  Cervical cancer screening -     Ambulatory referral to Gynecology     Follow-up: Return in about 1 year (around 10/23/2023).  Sanda Linger, MD

## 2022-10-24 ENCOUNTER — Other Ambulatory Visit (HOSPITAL_COMMUNITY): Payer: Self-pay

## 2022-10-25 ENCOUNTER — Other Ambulatory Visit (HOSPITAL_COMMUNITY): Payer: Self-pay

## 2022-10-25 MED ORDER — AMPHETAMINE-DEXTROAMPHETAMINE 10 MG PO TABS
10.0000 mg | ORAL_TABLET | Freq: Two times a day (BID) | ORAL | 0 refills | Status: DC
  Filled 2022-10-25: qty 60, 30d supply, fill #0

## 2022-10-28 ENCOUNTER — Other Ambulatory Visit (HOSPITAL_COMMUNITY): Payer: Self-pay

## 2022-10-28 DIAGNOSIS — Z803 Family history of malignant neoplasm of breast: Secondary | ICD-10-CM | POA: Diagnosis not present

## 2022-10-28 DIAGNOSIS — Z6822 Body mass index (BMI) 22.0-22.9, adult: Secondary | ICD-10-CM | POA: Diagnosis not present

## 2022-10-28 DIAGNOSIS — Z01419 Encounter for gynecological examination (general) (routine) without abnormal findings: Secondary | ICD-10-CM | POA: Diagnosis not present

## 2022-10-28 DIAGNOSIS — Z79890 Hormone replacement therapy (postmenopausal): Secondary | ICD-10-CM | POA: Diagnosis not present

## 2022-10-28 MED ORDER — ESTRADIOL 0.5 MG PO TABS
0.5000 mg | ORAL_TABLET | Freq: Every day | ORAL | 4 refills | Status: DC
  Filled 2023-01-06: qty 90, 90d supply, fill #0
  Filled 2023-03-30: qty 90, 90d supply, fill #1
  Filled 2023-06-26: qty 90, 90d supply, fill #2
  Filled 2023-09-02 – 2023-09-08 (×2): qty 90, 90d supply, fill #3

## 2022-11-12 ENCOUNTER — Other Ambulatory Visit (HOSPITAL_COMMUNITY): Payer: Self-pay

## 2022-12-24 ENCOUNTER — Other Ambulatory Visit (HOSPITAL_COMMUNITY): Payer: Self-pay

## 2022-12-25 ENCOUNTER — Other Ambulatory Visit (HOSPITAL_COMMUNITY): Payer: Self-pay

## 2022-12-25 ENCOUNTER — Other Ambulatory Visit: Payer: Self-pay

## 2022-12-25 MED ORDER — AMPHETAMINE-DEXTROAMPHETAMINE 10 MG PO TABS
10.0000 mg | ORAL_TABLET | Freq: Two times a day (BID) | ORAL | 0 refills | Status: DC
  Filled 2022-12-25: qty 60, 30d supply, fill #0

## 2022-12-25 MED ORDER — CLONAZEPAM 1 MG PO TABS
0.5000 mg | ORAL_TABLET | Freq: Every evening | ORAL | 1 refills | Status: DC | PRN
  Filled 2022-12-25 – 2023-03-30 (×2): qty 90, 90d supply, fill #0

## 2023-01-07 ENCOUNTER — Other Ambulatory Visit: Payer: Self-pay

## 2023-01-07 ENCOUNTER — Other Ambulatory Visit (HOSPITAL_COMMUNITY): Payer: Self-pay

## 2023-02-18 ENCOUNTER — Other Ambulatory Visit (HOSPITAL_COMMUNITY): Payer: Self-pay

## 2023-02-19 ENCOUNTER — Other Ambulatory Visit (HOSPITAL_COMMUNITY): Payer: Self-pay

## 2023-02-19 ENCOUNTER — Other Ambulatory Visit: Payer: Self-pay

## 2023-02-19 MED ORDER — AMPHETAMINE-DEXTROAMPHETAMINE 10 MG PO TABS
ORAL_TABLET | ORAL | 0 refills | Status: DC
  Filled 2023-02-19: qty 60, 30d supply, fill #0

## 2023-03-06 ENCOUNTER — Other Ambulatory Visit (HOSPITAL_BASED_OUTPATIENT_CLINIC_OR_DEPARTMENT_OTHER): Payer: Self-pay

## 2023-03-06 MED ORDER — NEOMYCIN-POLYMYXIN-DEXAMETH 3.5-10000-0.1 OP OINT
1.0000 | TOPICAL_OINTMENT | Freq: Two times a day (BID) | OPHTHALMIC | 1 refills | Status: DC
  Filled 2023-03-06: qty 3.5, 14d supply, fill #0

## 2023-03-11 ENCOUNTER — Other Ambulatory Visit (HOSPITAL_BASED_OUTPATIENT_CLINIC_OR_DEPARTMENT_OTHER): Payer: Self-pay

## 2023-03-30 ENCOUNTER — Other Ambulatory Visit (HOSPITAL_COMMUNITY): Payer: Self-pay

## 2023-03-31 ENCOUNTER — Other Ambulatory Visit (HOSPITAL_COMMUNITY): Payer: Self-pay

## 2023-03-31 ENCOUNTER — Other Ambulatory Visit: Payer: Self-pay

## 2023-03-31 MED ORDER — AMPHETAMINE-DEXTROAMPHETAMINE 10 MG PO TABS
10.0000 mg | ORAL_TABLET | Freq: Two times a day (BID) | ORAL | 0 refills | Status: DC
  Filled 2023-03-31: qty 60, 30d supply, fill #0

## 2023-04-21 ENCOUNTER — Ambulatory Visit: Payer: 59 | Admitting: Dermatology

## 2023-04-21 ENCOUNTER — Encounter: Payer: Self-pay | Admitting: Dermatology

## 2023-04-21 ENCOUNTER — Other Ambulatory Visit (HOSPITAL_COMMUNITY): Payer: Self-pay

## 2023-04-21 VITALS — BP 122/78 | HR 89

## 2023-04-21 DIAGNOSIS — L720 Epidermal cyst: Secondary | ICD-10-CM

## 2023-04-21 DIAGNOSIS — L409 Psoriasis, unspecified: Secondary | ICD-10-CM | POA: Diagnosis not present

## 2023-04-21 MED ORDER — ZORYVE 0.3 % EX CREA: TOPICAL_CREAM | CUTANEOUS | 3 refills | Status: DC

## 2023-04-21 MED ORDER — CLOBETASOL PROPIONATE 0.05 % EX SOLN
1.0000 | Freq: Two times a day (BID) | CUTANEOUS | 3 refills | Status: DC
  Filled 2023-04-21: qty 50, 25d supply, fill #0
  Filled 2023-05-23: qty 50, 25d supply, fill #1
  Filled 2023-06-26: qty 50, 25d supply, fill #2
  Filled 2023-09-02: qty 50, 25d supply, fill #3

## 2023-04-21 NOTE — Patient Instructions (Addendum)
Hello Pam,  Thank you for visiting my office today. Your dedication to managing your psoriasis and improving your health is greatly appreciated. Below is a summary of the key instructions from our discussion today:  -Diagnosis: Psoriasis  - Topical Treatments:   - Zoryve: Apply to the affected areas on the body, face, ears, and buttocks.   - Clobetasol Steroid Liquid: Use on the scalp twice a day until lesion is clear (no longer itchy or scaly).   - DHS Zinc Shampoo: Apply to the scalp, leave for 3 minutes before rinsing, and then proceed with your regular shampoo and conditioner routine.   - Follow-Up:   - We will schedule a return visit in 2 months to evaluate the effectiveness of the treatment and discuss further options.  Engineer, manufacturing and Pharmacy:   - Your prescription will be managed by a specialty pharmacy called [facility], which will coordinate directly with you regarding insurance and copay details.  - Additional Advice:   - For the bump on your eyelid, please consult an ophthalmologist for potential removal.   - Continue using Vanicream as recommended by your allergist to minimize contact dermatitis.  Please refer to your after-visit summary for the name of the shampoo and further details. Should you have any questions or concerns before our next appointment, please do not hesitate to contact the office.  Warm regards,  Dr. [facility]    Your prescription was sent to [facility] in [place]. A representative from [facility] will contact you within 2 business hours to verify your address and insurance information to schedule a free delivery. If for any reason you do not receive a phone call from them, please reach out to them. Their phone number is [phone] and their hours are Monday-Friday 9:00 am-5:00 pm.           Important Information  Due to recent changes in healthcare laws, you may see results of your pathology and/or laboratory  studies on MyChart before the doctors have had a chance to review them. We understand that in some cases there may be results that are confusing or concerning to you. Please understand that not all results are received at the same time and often the doctors may need to interpret multiple results in order to provide you with the best plan of care or course of treatment. Therefore, we ask that you please give [place] 2 business days to thoroughly review all your results before contacting the office for clarification. Should we see a critical lab result, you will be contacted sooner.   If You Need Anything After Your Visit  If you have any questions or concerns for your doctor, please call our main line at [phone] If no one answers, please leave a voicemail as directed and we will return your call as soon as possible. Messages left after 4 pm will be answered the following business day.   You may also send [place] a message via MyChart. We typically respond to MyChart messages within 1-2 business days.  For prescription refills, please ask your pharmacy to contact our office. Our fax number is [phone].  If you have an urgent issue when the clinic is closed that cannot wait until the next business day, you can page your doctor at the number below.    Please note that while we do our best to be available for urgent issues outside of office hours, we are not available 24/7.   If you have an urgent issue  and are unable to reach [place], you may choose to seek medical care at your doctor's office, retail clinic, urgent care center, or emergency room.  If you have a medical emergency, please immediately call 911 or go to the emergency department. In the event of inclement weather, please call our main line at [phone] for an update on the status of any delays or closures.  Dermatology Medication Tips: Please keep the boxes that topical medications come in in order to help keep track of the instructions  about where and how to use these. Pharmacies typically print the medication instructions only on the boxes and not directly on the medication tubes.   If your medication is too expensive, please contact our office at [phone] or send [place] a message through MyChart.   We are unable to tell what your co-pay for medications will be in advance as this is different depending on your insurance coverage. However, we may be able to find a substitute medication at lower cost or fill out paperwork to get insurance to cover a needed medication.   If a prior authorization is required to get your medication covered by your insurance company, please allow [place] 1-2 business days to complete this process.  Drug prices often vary depending on where the prescription is filled and some pharmacies may offer cheaper prices.  The website www.goodrx.com contains coupons for medications through different pharmacies. The prices here do not account for what the cost may be with help from insurance (it may be cheaper with your insurance), but the website can give you the price if you did not use any insurance.  - You can print the associated coupon and take it with your prescription to the pharmacy.  - You may also stop by our office during regular business hours and pick up a GoodRx coupon card.  - If you need your prescription sent electronically to a different pharmacy, notify our office through [facility] or by phone at [phone]

## 2023-04-21 NOTE — Progress Notes (Signed)
   New Patient Visit   Subjective  Janice Spencer is a 57 y.o. female who presents for the following: Rash. Thinks could be eczema. Posterior scalp, inside ears and on buttocks. Dur: 20 years on ears. Homemade plantain salve (from leaves) helps ears. Dur: 2 years for scalp. No hx of prescription treatment for this. 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  Ears itch, scratches until bleeds.  Also has been having eyelid swelling. Eye doctor prescribed a cortisone cream and Pataday eye drops.  Mother does have psoriasis    The following portions of the chart were reviewed this encounter and updated as appropriate: medications, allergies, medical history  Review of Systems:  No other skin or systemic complaints except as noted in HPI or Assessment and Plan.  Objective  Well appearing patient in no apparent distress; mood and affect are within normal limits.  A focused examination was performed of the following areas: Scalp, face, ears, neck, buttocks  Relevant exam findings are noted in the Assessment and Plan.    Assessment & Plan     PSORIASIS Exam: ery scaly plaque involving R occipital scalp, ears and buttock. 5% BSA.  Chronic and persistent condition with duration or expected duration over one year. Condition is bothersome/symptomatic for patient. Currently flared.   patient denies joint pain  Psoriasis is a chronic non-curable, but treatable genetic/hereditary disease that may have other systemic features affecting other organ systems such as joints (Psoriatic Arthritis). It is associated with an increased risk of inflammatory bowel disease, heart disease, non-alcoholic fatty liver disease, and depression.  Treatments include light and laser treatments; topical medications; and systemic medications including oral and injectables.  Treatment Plan: Start Zoryve cream once daily to affected areas.   Start Clobetasol 0.05% solution twice daily to scalp 2-3 weeks as needed for  itching/scaly. Avoid applying to face, groin, and axilla. Use as directed. Long-term use can cause thinning of the skin.  Recommend using DHS Zinc shampoo. Lather on scalp, leave on 3-5 minutes. Okay to follow with your own shampoo and conditioner.   Discussed option of oral Otezla. Consider trying if not improving or controlled on topical treatments.   Milia - tiny firm white papule at left lower eyelid rim. - type of cyst - benign - sometimes these will clear with nightly OTC adapalene/Differin 0.1% gel or retinol. - may be extracted if symptomatic - observe   Return in about 2 months (around 06/21/2023) for Psoriasis Follow Up.  I, Lawson Radar, CMA, am acting as scribe for [employer], DO.   Documentation: I have reviewed the above documentation for accuracy and completeness, and I agree with the above.  Langston Reusing, DO

## 2023-04-23 ENCOUNTER — Other Ambulatory Visit (HOSPITAL_COMMUNITY): Payer: Self-pay

## 2023-05-07 ENCOUNTER — Other Ambulatory Visit (HOSPITAL_COMMUNITY): Payer: Self-pay

## 2023-05-07 ENCOUNTER — Encounter: Payer: Self-pay | Admitting: Dermatology

## 2023-05-07 MED ORDER — TRIAMCINOLONE ACETONIDE 0.1 % EX CREA
1.0000 | TOPICAL_CREAM | Freq: Two times a day (BID) | CUTANEOUS | 0 refills | Status: DC
  Filled 2023-05-07: qty 454, 30d supply, fill #0

## 2023-05-07 NOTE — Telephone Encounter (Signed)
We can send an rx for triamcinolone to be applied BID for 2 weeks on, 2 weeks off, keep repeating until clear

## 2023-05-23 ENCOUNTER — Other Ambulatory Visit (HOSPITAL_COMMUNITY): Payer: Self-pay

## 2023-05-23 ENCOUNTER — Other Ambulatory Visit: Payer: Self-pay

## 2023-05-26 ENCOUNTER — Other Ambulatory Visit (HOSPITAL_COMMUNITY): Payer: Self-pay

## 2023-05-26 MED ORDER — AMPHETAMINE-DEXTROAMPHETAMINE 10 MG PO TABS
10.0000 mg | ORAL_TABLET | Freq: Two times a day (BID) | ORAL | 0 refills | Status: DC
Start: 2023-05-25 — End: 2023-06-27
  Filled 2023-05-26: qty 60, 30d supply, fill #0

## 2023-05-29 ENCOUNTER — Ambulatory Visit: Payer: 59 | Admitting: Dermatology

## 2023-06-23 ENCOUNTER — Ambulatory Visit: Payer: 59 | Admitting: Dermatology

## 2023-06-26 ENCOUNTER — Other Ambulatory Visit (HOSPITAL_COMMUNITY): Payer: Self-pay

## 2023-06-27 ENCOUNTER — Other Ambulatory Visit (HOSPITAL_COMMUNITY): Payer: Self-pay

## 2023-06-27 ENCOUNTER — Other Ambulatory Visit: Payer: Self-pay

## 2023-06-27 MED ORDER — AMPHETAMINE-DEXTROAMPHETAMINE 10 MG PO TABS
10.0000 mg | ORAL_TABLET | Freq: Two times a day (BID) | ORAL | 0 refills | Status: DC
Start: 2023-06-27 — End: 2023-09-02
  Filled 2023-06-27: qty 60, 30d supply, fill #0

## 2023-06-27 MED ORDER — CLONAZEPAM 1 MG PO TABS
0.5000 mg | ORAL_TABLET | Freq: Every evening | ORAL | 1 refills | Status: DC | PRN
Start: 2023-06-27 — End: 2023-11-04
  Filled 2023-06-27: qty 64, 64d supply, fill #0
  Filled 2023-06-27: qty 26, 26d supply, fill #0
  Filled 2023-09-02 – 2023-09-23 (×3): qty 90, 90d supply, fill #1

## 2023-08-26 ENCOUNTER — Ambulatory Visit (INDEPENDENT_AMBULATORY_CARE_PROVIDER_SITE_OTHER): Payer: 59 | Admitting: Dermatology

## 2023-08-26 ENCOUNTER — Encounter: Payer: Self-pay | Admitting: Dermatology

## 2023-08-26 VITALS — BP 112/70 | HR 83

## 2023-08-26 DIAGNOSIS — L409 Psoriasis, unspecified: Secondary | ICD-10-CM

## 2023-08-26 DIAGNOSIS — L649 Androgenic alopecia, unspecified: Secondary | ICD-10-CM | POA: Diagnosis not present

## 2023-08-26 DIAGNOSIS — Z79899 Other long term (current) drug therapy: Secondary | ICD-10-CM | POA: Diagnosis not present

## 2023-08-26 NOTE — Patient Instructions (Addendum)

## 2023-08-26 NOTE — Progress Notes (Signed)
   Follow-Up Visit   Subjective  Janice Spencer is a 58 y.o. female who presents for the following: Psoriasis  Patient present today for follow up visit for Psoriasis. Patient was last evaluated on 04/21/23. At this visit patient was prescribed Zoryve cream once daily to affected areas, Clobetasol 0.05% solution twice daily to scalp 2-3 weeks as needed for itching/scaly, Recommend using DHS Zinc shampoo. Patient reports sxs are  improving but not at goal . Patient denies medication changes.  The following portions of the chart were reviewed this encounter and updated as appropriate: medications, allergies, medical history  Review of Systems:  No other skin or systemic complaints except as noted in HPI or Assessment and Plan.  Objective  Well appearing patient in no apparent distress; mood and affect are within normal limits.  A focused examination was performed of the following areas: Scalp, B/L Ears and Buttock  Relevant exam findings are noted in the Assessment and Plan.    Assessment & Plan   PSORIASIS Exam: Mild erythematous papules/plaques with silvery scale, guttate pink scaly papules. 2% BSA.  Improving  Patient report joint pain  Psoriasis is a chronic non-curable, but treatable genetic/hereditary disease that may have other systemic features affecting other organ systems such as joints (Psoriatic Arthritis). It is associated with an increased risk of inflammatory bowel disease, heart disease, non-alcoholic fatty liver disease, and depression.  Treatments include light and laser treatments; topical medications; and systemic medications including oral and injectables.  Treatment Plan: - Recommended continuing TMC cream to the flared areas on the body - Recommended continuing Clobetasol Drops PRN in addition to washing with DHS Zinc - We will plan to follow up in 1 year for Psoriasis  ANDROGENETIC ALOPECIA (FEMALE PATTERN HAIR LOSS) Exam: Diffuse thinning of the crown and  widening of the midline part with retention of the frontal hairline  Flared  Female Androgenic Alopecia is a chronic condition related to genetics and/or hormonal changes.  In women androgenetic alopecia is commonly associated with menopause but may occur any time after puberty.  It causes hair thinning primarily on the crown with widening of the part and temporal hairline recession.  Can use OTC Rogaine (minoxidil) 5% solution/foam as directed.  Oral treatments in female patients who have no contraindication may include : - Low dose oral minoxidil 1.25 - 5mg  daily - Spironolactone 50 - 100mg  bid - Finasteride 2.5 - 5 mg daily Adjunctive therapies include: - Low Level Laser Light Therapy (LLLT) - Platelet-rich plasma injections (PRP) - Hair Transplants or scalp reduction   Treatment Plan: - Recommended taking daily Nutrafol Supplements - If not improved at follow up we will plan to start topicals at next visit - We will plan to follow up in 6 months   Long term medication management.  Patient is using long term (months to years) prescription medication  to control their dermatologic condition.  These medications require periodic monitoring to evaluate for efficacy and side effects and may require periodic laboratory monitoring.     No follow-ups on file.  Documentation: I have reviewed the above documentation for accuracy and completeness, and I agree with the above.  Stasia Cavalier, am acting as scribe for Langston Reusing, DO.   Langston Reusing, DO

## 2023-09-02 ENCOUNTER — Other Ambulatory Visit (HOSPITAL_COMMUNITY): Payer: Self-pay

## 2023-09-02 ENCOUNTER — Other Ambulatory Visit: Payer: Self-pay

## 2023-09-02 MED ORDER — AMPHETAMINE-DEXTROAMPHETAMINE 10 MG PO TABS
10.0000 mg | ORAL_TABLET | Freq: Two times a day (BID) | ORAL | 0 refills | Status: DC
  Filled 2023-09-02: qty 60, 30d supply, fill #0

## 2023-09-08 ENCOUNTER — Other Ambulatory Visit (HOSPITAL_COMMUNITY): Payer: Self-pay

## 2023-09-23 ENCOUNTER — Other Ambulatory Visit: Payer: Self-pay

## 2023-09-23 ENCOUNTER — Other Ambulatory Visit (HOSPITAL_COMMUNITY): Payer: Self-pay

## 2023-11-04 ENCOUNTER — Other Ambulatory Visit: Payer: Self-pay | Admitting: Dermatology

## 2023-11-04 ENCOUNTER — Other Ambulatory Visit (HOSPITAL_COMMUNITY): Payer: Self-pay

## 2023-11-04 ENCOUNTER — Other Ambulatory Visit: Payer: Self-pay

## 2023-11-04 ENCOUNTER — Encounter: Payer: Self-pay | Admitting: Internal Medicine

## 2023-11-04 ENCOUNTER — Ambulatory Visit (INDEPENDENT_AMBULATORY_CARE_PROVIDER_SITE_OTHER): Payer: 59 | Admitting: Internal Medicine

## 2023-11-04 VITALS — BP 110/60 | HR 93 | Temp 98.7°F | Ht 63.5 in | Wt 144.4 lb

## 2023-11-04 DIAGNOSIS — J301 Allergic rhinitis due to pollen: Secondary | ICD-10-CM | POA: Diagnosis not present

## 2023-11-04 DIAGNOSIS — T63481A Toxic effect of venom of other arthropod, accidental (unintentional), initial encounter: Secondary | ICD-10-CM

## 2023-11-04 DIAGNOSIS — T782XXA Anaphylactic shock, unspecified, initial encounter: Secondary | ICD-10-CM | POA: Diagnosis not present

## 2023-11-04 DIAGNOSIS — Z79890 Hormone replacement therapy (postmenopausal): Secondary | ICD-10-CM | POA: Diagnosis not present

## 2023-11-04 DIAGNOSIS — R61 Generalized hyperhidrosis: Secondary | ICD-10-CM | POA: Diagnosis not present

## 2023-11-04 DIAGNOSIS — Z6824 Body mass index (BMI) 24.0-24.9, adult: Secondary | ICD-10-CM | POA: Diagnosis not present

## 2023-11-04 DIAGNOSIS — Z0001 Encounter for general adult medical examination with abnormal findings: Secondary | ICD-10-CM

## 2023-11-04 DIAGNOSIS — Z01419 Encounter for gynecological examination (general) (routine) without abnormal findings: Secondary | ICD-10-CM | POA: Diagnosis not present

## 2023-11-04 MED ORDER — DRYSOL 20 % EX SOLN
1.0000 | CUTANEOUS | 1 refills | Status: AC
Start: 2023-11-06 — End: ?
  Filled 2023-11-04: qty 60, 30d supply, fill #0
  Filled 2024-03-05: qty 60, 30d supply, fill #1

## 2023-11-04 MED ORDER — EPINEPHRINE 0.3 MG/0.3ML IJ SOAJ
0.3000 mg | INTRAMUSCULAR | 3 refills | Status: AC | PRN
  Filled 2023-11-04: qty 2, 30d supply, fill #0

## 2023-11-04 MED ORDER — ESTRADIOL 0.5 MG PO TABS
0.5000 mg | ORAL_TABLET | Freq: Every day | ORAL | 4 refills | Status: AC
  Filled 2023-11-04 – 2023-12-26 (×3): qty 90, 90d supply, fill #0
  Filled 2024-03-05 – 2024-03-09 (×2): qty 90, 90d supply, fill #1
  Filled 2024-06-17: qty 90, 90d supply, fill #2

## 2023-11-04 MED ORDER — LEVOCETIRIZINE DIHYDROCHLORIDE 5 MG PO TABS
5.0000 mg | ORAL_TABLET | Freq: Every evening | ORAL | 1 refills | Status: DC
  Filled 2023-11-04: qty 90, 90d supply, fill #0
  Filled 2024-03-05: qty 90, 90d supply, fill #1

## 2023-11-04 NOTE — Patient Instructions (Signed)
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
## 2023-11-05 ENCOUNTER — Other Ambulatory Visit (HOSPITAL_COMMUNITY): Payer: Self-pay

## 2023-11-05 ENCOUNTER — Other Ambulatory Visit: Payer: Self-pay

## 2023-11-05 MED ORDER — CLOBETASOL PROPIONATE 0.05 % EX SOLN
1.0000 | Freq: Two times a day (BID) | CUTANEOUS | 3 refills | Status: AC
  Filled 2023-11-05: qty 50, 25d supply, fill #0
  Filled 2024-03-05: qty 50, 25d supply, fill #1
  Filled 2024-06-17: qty 50, 25d supply, fill #2

## 2023-11-05 MED ORDER — AMPHETAMINE-DEXTROAMPHETAMINE 10 MG PO TABS
10.0000 mg | ORAL_TABLET | Freq: Two times a day (BID) | ORAL | 0 refills | Status: DC
  Filled 2023-11-05: qty 60, 30d supply, fill #0

## 2023-12-23 ENCOUNTER — Other Ambulatory Visit (HOSPITAL_COMMUNITY): Payer: Self-pay

## 2023-12-23 ENCOUNTER — Other Ambulatory Visit: Payer: Self-pay

## 2023-12-25 ENCOUNTER — Other Ambulatory Visit: Payer: Self-pay

## 2023-12-25 ENCOUNTER — Other Ambulatory Visit (HOSPITAL_COMMUNITY): Payer: Self-pay

## 2023-12-25 MED ORDER — AMPHETAMINE-DEXTROAMPHETAMINE 10 MG PO TABS
10.0000 mg | ORAL_TABLET | Freq: Two times a day (BID) | ORAL | 0 refills | Status: DC
  Filled 2023-12-25 – 2023-12-26 (×2): qty 60, 30d supply, fill #0

## 2023-12-25 MED ORDER — CLONAZEPAM 1 MG PO TABS
0.5000 mg | ORAL_TABLET | Freq: Every evening | ORAL | 1 refills | Status: DC | PRN
  Filled 2023-12-25 – 2023-12-26 (×3): qty 90, 90d supply, fill #0
  Filled 2024-03-05 – 2024-03-26 (×2): qty 90, 90d supply, fill #1

## 2023-12-26 ENCOUNTER — Other Ambulatory Visit (HOSPITAL_COMMUNITY): Payer: Self-pay

## 2023-12-26 ENCOUNTER — Other Ambulatory Visit (HOSPITAL_BASED_OUTPATIENT_CLINIC_OR_DEPARTMENT_OTHER): Payer: Self-pay

## 2023-12-30 ENCOUNTER — Other Ambulatory Visit (HOSPITAL_COMMUNITY): Payer: Self-pay

## 2024-02-24 ENCOUNTER — Encounter: Payer: Self-pay | Admitting: Dermatology

## 2024-02-24 ENCOUNTER — Ambulatory Visit: Payer: 59 | Admitting: Dermatology

## 2024-02-24 VITALS — BP 115/73 | HR 74

## 2024-02-24 DIAGNOSIS — L649 Androgenic alopecia, unspecified: Secondary | ICD-10-CM

## 2024-02-24 DIAGNOSIS — L409 Psoriasis, unspecified: Secondary | ICD-10-CM | POA: Diagnosis not present

## 2024-02-24 MED ORDER — SAFETY SEAL MISCELLANEOUS MISC: 1.0000 | Freq: Every morning | 8 refills | Status: AC

## 2024-02-24 NOTE — Patient Instructions (Addendum)
 Date: Tue Feb 24 2024  Hello Pam,  Thank you for visiting today. Here is a summary of the key instructions:  - Medications:   - Apply 8% minoxidil / Finasteride solution to scalp every morning   - Make a part and apply 1-2 drops   - Rub it in along the part   - Apply to the middle and crown areas   - Wash hands after applying  - Supplements:   - Consider taking Viviscal supplement   - Consider taking Vital Proteins collagen supplement (powder, gummies, or capsules available)  - Lifestyle Changes:   - Continue using city water instead of well water  - Follow-up:   - Return in 4 months for follow-up on psoriasis and hair loss  Please reach out if you have any questions or concerns.  Warm regards,  Dr. [facility]  Your provider has sent your prescription to [facility] in [place], [place] . A pharmacy representative will call you to confirm details and take your payment information. If you do not receive a call within 24 hours, please contact the pharmacy at [phone] or [phone]. Your unique skincare compound is being formulated in our lab (most compounds take less than 24 hours). Your prescription is shipped vis USPS to your mailbox (2-4 business days). Priority shipping is available at an additional cost. Once received, you will electronically sign/acknowledge that you received your prescription. The pharmacy hours are Monday-Friday 9 am-6 pm EST and Saturday 9 am-1 pm EST.            Important Information   Due to recent changes in healthcare laws, you may see results of your pathology and/or laboratory studies on MyChart before the doctors have had a chance to review them. We understand that in some cases there may be results that are confusing or concerning to you. Please understand that not all results are received at the same time and often the doctors may need to interpret multiple results in order to provide you with the best plan of care  or course of treatment. Therefore, we ask that you please give us  2 business days to thoroughly review all your results before contacting the office for clarification. Should we see a critical lab result, you will be contacted sooner.     If You Need Anything After Your Visit   If you have any questions or concerns for your doctor, please call our main line at [phone]. If no one answers, please leave a voicemail as directed and we will return your call as soon as possible. Messages left after 4 pm will be answered the following business day.    You may also send us  a message via MyChart. We typically respond to MyChart messages within 1-2 business days.  For prescription refills, please ask your pharmacy to contact our office. Our fax number is [phone].  If you have an urgent issue when the clinic is closed that cannot wait until the next business day, you can page your doctor at the number below.     Please note that while we do our best to be available for urgent issues outside of office hours, we are not available 24/7.    If you have an urgent issue and are unable to reach us , you may choose to seek medical care at your doctor's office, retail clinic, urgent care center, or emergency room.   If you have a medical emergency, please immediately call 911 or go to the emergency department.  In the event of inclement weather, please call our main line at [phone] for an update on the status of any delays or closures.  Dermatology Medication Tips: Please keep the boxes that topical medications come in in order to help keep track of the instructions about where and how to use these. Pharmacies typically print the medication instructions only on the boxes and not directly on the medication tubes.   If your medication is too expensive, please contact our office at [phone] or send us  a message through MyChart.    We are unable to tell what your co-pay for medications will be in  advance as this is different depending on your insurance coverage. However, we may be able to find a substitute medication at lower cost or fill out paperwork to get insurance to cover a needed medication.    If a prior authorization is required to get your medication covered by your insurance company, please allow us  1-2 business days to complete this process.   Drug prices often vary depending on where the prescription is filled and some pharmacies may offer cheaper prices.   The website www.goodrx.com contains coupons for medications through different pharmacies. The prices here do not account for what the cost may be with help from insurance (it may be cheaper with your insurance), but the website can give you the price if you did not use any insurance.  - You can print the associated coupon and take it with your prescription to the pharmacy.  - You may also stop by our office during regular business hours and pick up a GoodRx coupon card.  - If you need your prescription sent electronically to a different pharmacy, notify our office through [facility] or by phone at [phone]

## 2024-02-24 NOTE — Progress Notes (Signed)
   Follow-Up Visit   Subjective  Janice Spencer is a 58 y.o. female who presents for the following: Androgenetic Alopecia  Patient present today for follow up visit for Androgenetic Alopecia. Patient was last evaluated on 08/26/23. At this visit patient was recommended to take otc Nutrafol Supplements. Patient reports sxs are unchanged. Patient denies medication changes.  The following portions of the chart were reviewed this encounter and updated as appropriate: medications, allergies, medical history  Review of Systems:  No other skin or systemic complaints except as noted in HPI or Assessment and Plan.  Objective  Well appearing patient in no apparent distress; mood and affect are within normal limits.  A focused examination was performed of the following areas: Scalp  Relevant exam findings are noted in the Assessment and Plan.              Assessment & Plan   1. Androgenetic Alopecia - Assessment: Patient experiencing early stages of androgenetic alopecia with increased hair shedding during washing and family history of progressive hair loss. Condition likely exacerbated by hormonal changes associated with perimenopause, causing hair follicle miniaturization and shedding in hormone-sensitive scalp areas.  - Plan:    Prescribe compounded 8% minoxidil with finasteride for topical application    Apply 1-2 drops to affected areas of scalp daily in the morning    Informed patient of potential initial increase in shedding and 41-month timeline for visible results    Discussed importance of consistent, long-term use for maintaining results    Recommend Viviscal supplement as alternative to Neutrophil which caused nausea    Suggest collagen supplementation in powder, gummies, or capsules  2. Psoriasis - Assessment: Patient has history of psoriasis. Recent change from well water to city water has resulted in reduced itching and softer hair, suggesting potential improvement in skin  condition. - Plan:    Continue current management with clobetasol  as needed    Reassess psoriasis at follow-up appointment  Follow-up in 4 months to assess progress.   ANDROGENETIC ALOPECIA   Related Medications Safety Seal Miscellaneous MISC Apply 1 Application topically in the morning. Medication Name: Hormonic Hair Solution (Minoxidil 8%,Finasteride 0.05%)  Return in about 5 months (around 07/26/2024) for Androgenetic Alopecia F/U.  I, Jetta Ager, am acting as Neurosurgeon for [employer], DO.  Documentation: I have reviewed the above documentation for accuracy and completeness, and I agree with the above.  Delon Lenis, DO

## 2024-03-05 ENCOUNTER — Other Ambulatory Visit (HOSPITAL_BASED_OUTPATIENT_CLINIC_OR_DEPARTMENT_OTHER): Payer: Self-pay

## 2024-03-06 ENCOUNTER — Other Ambulatory Visit: Payer: Self-pay

## 2024-03-08 ENCOUNTER — Other Ambulatory Visit (HOSPITAL_COMMUNITY): Payer: Self-pay

## 2024-03-08 MED ORDER — AMPHETAMINE-DEXTROAMPHETAMINE 10 MG PO TABS
10.0000 mg | ORAL_TABLET | Freq: Two times a day (BID) | ORAL | 0 refills | Status: DC
  Filled 2024-03-08: qty 60, 30d supply, fill #0

## 2024-03-09 ENCOUNTER — Other Ambulatory Visit (HOSPITAL_COMMUNITY): Payer: Self-pay

## 2024-03-12 ENCOUNTER — Other Ambulatory Visit (HOSPITAL_COMMUNITY): Payer: Self-pay

## 2024-03-29 ENCOUNTER — Other Ambulatory Visit: Payer: Self-pay

## 2024-06-17 ENCOUNTER — Other Ambulatory Visit: Payer: Self-pay

## 2024-06-17 ENCOUNTER — Other Ambulatory Visit: Payer: Self-pay | Admitting: Internal Medicine

## 2024-06-17 ENCOUNTER — Other Ambulatory Visit (HOSPITAL_COMMUNITY): Payer: Self-pay

## 2024-06-17 DIAGNOSIS — J301 Allergic rhinitis due to pollen: Secondary | ICD-10-CM

## 2024-06-18 ENCOUNTER — Other Ambulatory Visit (HOSPITAL_COMMUNITY): Payer: Self-pay

## 2024-06-18 ENCOUNTER — Other Ambulatory Visit: Payer: Self-pay

## 2024-06-18 MED ORDER — LEVOCETIRIZINE DIHYDROCHLORIDE 5 MG PO TABS
5.0000 mg | ORAL_TABLET | Freq: Every evening | ORAL | 1 refills | Status: AC
  Filled 2024-06-18: qty 90, 90d supply, fill #0

## 2024-06-19 ENCOUNTER — Other Ambulatory Visit (HOSPITAL_COMMUNITY): Payer: Self-pay

## 2024-06-19 MED ORDER — CLONAZEPAM 1 MG PO TABS
0.5000 mg | ORAL_TABLET | Freq: Every evening | ORAL | 1 refills | Status: AC | PRN
  Filled 2024-06-19 – 2024-06-25 (×2): qty 90, 90d supply, fill #0

## 2024-06-19 MED ORDER — AMPHETAMINE-DEXTROAMPHETAMINE 10 MG PO TABS
10.0000 mg | ORAL_TABLET | Freq: Two times a day (BID) | ORAL | 0 refills | Status: AC
  Filled 2024-06-19: qty 60, 30d supply, fill #0

## 2024-06-21 ENCOUNTER — Other Ambulatory Visit: Payer: Self-pay

## 2024-06-24 ENCOUNTER — Other Ambulatory Visit (HOSPITAL_COMMUNITY): Payer: Self-pay

## 2024-06-25 ENCOUNTER — Other Ambulatory Visit (HOSPITAL_COMMUNITY): Payer: Self-pay

## 2024-08-02 ENCOUNTER — Ambulatory Visit: Admitting: Dermatology

## 2024-11-08 ENCOUNTER — Ambulatory Visit: Admitting: Dermatology
